# Patient Record
Sex: Female | Born: 1950
Health system: Southern US, Community
[De-identification: ages and names within clinical notes are randomized; demographics above are authoritative.]

## PROBLEM LIST (undated history)

## (undated) DIAGNOSIS — C801 Malignant (primary) neoplasm, unspecified: Secondary | ICD-10-CM

## (undated) DIAGNOSIS — K659 Peritonitis, unspecified: Secondary | ICD-10-CM

## (undated) DIAGNOSIS — C4490 Unspecified malignant neoplasm of skin, unspecified: Secondary | ICD-10-CM

## (undated) DIAGNOSIS — L988 Other specified disorders of the skin and subcutaneous tissue: Secondary | ICD-10-CM

## (undated) DIAGNOSIS — E538 Deficiency of other specified B group vitamins: Secondary | ICD-10-CM

## (undated) DIAGNOSIS — R110 Nausea: Secondary | ICD-10-CM

## (undated) DIAGNOSIS — I10 Essential (primary) hypertension: Secondary | ICD-10-CM

## (undated) HISTORY — DX: Other specified disorders of the skin and subcutaneous tissue: L98.8

## (undated) HISTORY — PX: APPENDECTOMY: SHX54

## (undated) HISTORY — DX: Malignant (primary) neoplasm, unspecified: C80.1

## (undated) HISTORY — DX: Unspecified malignant neoplasm of skin, unspecified: C44.90

## (undated) HISTORY — PX: ILEOSTOMY: SHX1783

## (undated) HISTORY — PX: COLOSTOMY: SHX63

## (undated) HISTORY — PX: ENTEROCUTANEOUS FISTULA CLOSURE: SHX1510

## (undated) HISTORY — PX: ABDOMINAL HYSTERECTOMY: SHX81

## (undated) HISTORY — DX: Peritonitis, unspecified: K65.9

## (undated) HISTORY — DX: Nausea: R11.0

## (undated) HISTORY — DX: Deficiency of other specified B group vitamins: E53.8

---

## 2005-04-19 ENCOUNTER — Ambulatory Visit: Payer: Self-pay | Admitting: Family Medicine

## 2007-11-20 ENCOUNTER — Ambulatory Visit: Payer: Self-pay | Admitting: Sports Medicine

## 2007-11-20 DIAGNOSIS — M25569 Pain in unspecified knee: Secondary | ICD-10-CM | POA: Insufficient documentation

## 2007-11-20 DIAGNOSIS — IMO0002 Reserved for concepts with insufficient information to code with codable children: Secondary | ICD-10-CM | POA: Insufficient documentation

## 2007-12-11 ENCOUNTER — Ambulatory Visit: Payer: Self-pay | Admitting: Sports Medicine

## 2007-12-11 DIAGNOSIS — I10 Essential (primary) hypertension: Secondary | ICD-10-CM | POA: Insufficient documentation

## 2007-12-11 DIAGNOSIS — M23302 Other meniscus derangements, unspecified lateral meniscus, unspecified knee: Secondary | ICD-10-CM | POA: Insufficient documentation

## 2007-12-17 ENCOUNTER — Encounter: Admission: RE | Admit: 2007-12-17 | Discharge: 2007-12-17 | Payer: Self-pay | Admitting: Sports Medicine

## 2007-12-19 ENCOUNTER — Telehealth: Payer: Self-pay | Admitting: Sports Medicine

## 2007-12-20 ENCOUNTER — Encounter (INDEPENDENT_AMBULATORY_CARE_PROVIDER_SITE_OTHER): Payer: Self-pay | Admitting: *Deleted

## 2008-01-07 ENCOUNTER — Ambulatory Visit: Payer: Self-pay | Admitting: Sports Medicine

## 2008-02-18 ENCOUNTER — Ambulatory Visit: Payer: Self-pay | Admitting: Sports Medicine

## 2008-04-15 ENCOUNTER — Ambulatory Visit: Payer: Self-pay | Admitting: Sports Medicine

## 2008-06-16 ENCOUNTER — Ambulatory Visit: Payer: Self-pay | Admitting: Sports Medicine

## 2008-06-16 DIAGNOSIS — Z85038 Personal history of other malignant neoplasm of large intestine: Secondary | ICD-10-CM | POA: Insufficient documentation

## 2011-08-21 DIAGNOSIS — Z432 Encounter for attention to ileostomy: Secondary | ICD-10-CM | POA: Diagnosis not present

## 2011-08-21 DIAGNOSIS — C20 Malignant neoplasm of rectum: Secondary | ICD-10-CM | POA: Diagnosis not present

## 2011-08-21 DIAGNOSIS — Z452 Encounter for adjustment and management of vascular access device: Secondary | ICD-10-CM | POA: Diagnosis not present

## 2011-08-21 DIAGNOSIS — T819XXA Unspecified complication of procedure, initial encounter: Secondary | ICD-10-CM | POA: Diagnosis not present

## 2011-08-30 DIAGNOSIS — C20 Malignant neoplasm of rectum: Secondary | ICD-10-CM | POA: Diagnosis not present

## 2011-08-30 DIAGNOSIS — Z432 Encounter for attention to ileostomy: Secondary | ICD-10-CM | POA: Diagnosis not present

## 2011-08-30 DIAGNOSIS — T819XXA Unspecified complication of procedure, initial encounter: Secondary | ICD-10-CM | POA: Diagnosis not present

## 2011-08-30 DIAGNOSIS — Z452 Encounter for adjustment and management of vascular access device: Secondary | ICD-10-CM | POA: Diagnosis not present

## 2011-08-31 DIAGNOSIS — Z432 Encounter for attention to ileostomy: Secondary | ICD-10-CM | POA: Diagnosis not present

## 2011-08-31 DIAGNOSIS — T819XXA Unspecified complication of procedure, initial encounter: Secondary | ICD-10-CM | POA: Diagnosis not present

## 2011-08-31 DIAGNOSIS — C20 Malignant neoplasm of rectum: Secondary | ICD-10-CM | POA: Diagnosis not present

## 2011-08-31 DIAGNOSIS — Z452 Encounter for adjustment and management of vascular access device: Secondary | ICD-10-CM | POA: Diagnosis not present

## 2011-09-06 DIAGNOSIS — Z79899 Other long term (current) drug therapy: Secondary | ICD-10-CM | POA: Diagnosis not present

## 2011-09-06 DIAGNOSIS — K632 Fistula of intestine: Secondary | ICD-10-CM | POA: Diagnosis not present

## 2011-09-06 DIAGNOSIS — T8183XA Persistent postprocedural fistula, initial encounter: Secondary | ICD-10-CM | POA: Diagnosis not present

## 2011-09-11 DIAGNOSIS — Z79899 Other long term (current) drug therapy: Secondary | ICD-10-CM | POA: Diagnosis not present

## 2011-09-11 DIAGNOSIS — D649 Anemia, unspecified: Secondary | ICD-10-CM | POA: Diagnosis not present

## 2011-09-11 DIAGNOSIS — Z452 Encounter for adjustment and management of vascular access device: Secondary | ICD-10-CM | POA: Diagnosis not present

## 2011-09-11 DIAGNOSIS — Z432 Encounter for attention to ileostomy: Secondary | ICD-10-CM | POA: Diagnosis not present

## 2011-09-11 DIAGNOSIS — T819XXA Unspecified complication of procedure, initial encounter: Secondary | ICD-10-CM | POA: Diagnosis not present

## 2011-09-11 DIAGNOSIS — C20 Malignant neoplasm of rectum: Secondary | ICD-10-CM | POA: Diagnosis not present

## 2011-09-18 DIAGNOSIS — Z452 Encounter for adjustment and management of vascular access device: Secondary | ICD-10-CM | POA: Diagnosis not present

## 2011-09-18 DIAGNOSIS — D649 Anemia, unspecified: Secondary | ICD-10-CM | POA: Diagnosis not present

## 2011-09-18 DIAGNOSIS — Z79899 Other long term (current) drug therapy: Secondary | ICD-10-CM | POA: Diagnosis not present

## 2011-09-18 DIAGNOSIS — T819XXA Unspecified complication of procedure, initial encounter: Secondary | ICD-10-CM | POA: Diagnosis not present

## 2011-09-18 DIAGNOSIS — C20 Malignant neoplasm of rectum: Secondary | ICD-10-CM | POA: Diagnosis not present

## 2011-09-18 DIAGNOSIS — Z432 Encounter for attention to ileostomy: Secondary | ICD-10-CM | POA: Diagnosis not present

## 2011-09-20 DIAGNOSIS — Z85048 Personal history of other malignant neoplasm of rectum, rectosigmoid junction, and anus: Secondary | ICD-10-CM | POA: Diagnosis not present

## 2011-09-20 DIAGNOSIS — K632 Fistula of intestine: Secondary | ICD-10-CM | POA: Diagnosis not present

## 2011-09-25 ENCOUNTER — Inpatient Hospital Stay: Payer: Self-pay | Admitting: Internal Medicine

## 2011-09-25 DIAGNOSIS — E869 Volume depletion, unspecified: Secondary | ICD-10-CM | POA: Diagnosis not present

## 2011-09-25 DIAGNOSIS — Z932 Ileostomy status: Secondary | ICD-10-CM | POA: Diagnosis not present

## 2011-09-25 DIAGNOSIS — Z9089 Acquired absence of other organs: Secondary | ICD-10-CM | POA: Diagnosis not present

## 2011-09-25 DIAGNOSIS — Z91041 Radiographic dye allergy status: Secondary | ICD-10-CM | POA: Diagnosis not present

## 2011-09-25 DIAGNOSIS — Z800 Family history of malignant neoplasm of digestive organs: Secondary | ICD-10-CM | POA: Diagnosis not present

## 2011-09-25 DIAGNOSIS — N179 Acute kidney failure, unspecified: Secondary | ICD-10-CM | POA: Diagnosis not present

## 2011-09-25 DIAGNOSIS — R112 Nausea with vomiting, unspecified: Secondary | ICD-10-CM | POA: Diagnosis not present

## 2011-09-25 DIAGNOSIS — Z9221 Personal history of antineoplastic chemotherapy: Secondary | ICD-10-CM | POA: Diagnosis not present

## 2011-09-25 DIAGNOSIS — E871 Hypo-osmolality and hyponatremia: Secondary | ICD-10-CM | POA: Diagnosis not present

## 2011-09-25 DIAGNOSIS — R1110 Vomiting, unspecified: Secondary | ICD-10-CM | POA: Diagnosis not present

## 2011-09-25 DIAGNOSIS — Z90710 Acquired absence of both cervix and uterus: Secondary | ICD-10-CM | POA: Diagnosis not present

## 2011-09-25 DIAGNOSIS — Z888 Allergy status to other drugs, medicaments and biological substances status: Secondary | ICD-10-CM | POA: Diagnosis not present

## 2011-09-25 DIAGNOSIS — Z91018 Allergy to other foods: Secondary | ICD-10-CM | POA: Diagnosis not present

## 2011-09-25 DIAGNOSIS — D4959 Neoplasm of unspecified behavior of other genitourinary organ: Secondary | ICD-10-CM | POA: Diagnosis not present

## 2011-09-25 DIAGNOSIS — C20 Malignant neoplasm of rectum: Secondary | ICD-10-CM | POA: Diagnosis present

## 2011-09-25 DIAGNOSIS — Z87891 Personal history of nicotine dependence: Secondary | ICD-10-CM | POA: Diagnosis not present

## 2011-09-25 LAB — COMPREHENSIVE METABOLIC PANEL
Albumin: 4.9 g/dL (ref 3.4–5.0)
Alkaline Phosphatase: 149 U/L — ABNORMAL HIGH (ref 50–136)
Anion Gap: 21 — ABNORMAL HIGH (ref 7–16)
Calcium, Total: 10.1 mg/dL (ref 8.5–10.1)
Chloride: 85 mmol/L — ABNORMAL LOW (ref 98–107)
Co2: 15 mmol/L — ABNORMAL LOW (ref 21–32)
EGFR (African American): 14 — ABNORMAL LOW
EGFR (Non-African Amer.): 12 — ABNORMAL LOW
Osmolality: 272 (ref 275–301)
Potassium: 5 mmol/L (ref 3.5–5.1)
SGOT(AST): 54 U/L — ABNORMAL HIGH (ref 15–37)
Sodium: 121 mmol/L — ABNORMAL LOW (ref 136–145)

## 2011-09-25 LAB — URINALYSIS, COMPLETE
Bacteria: NONE SEEN
Glucose,UR: NEGATIVE mg/dL (ref 0–75)
Hyaline Cast: 59
Leukocyte Esterase: NEGATIVE
Nitrite: NEGATIVE
Ph: 5 (ref 4.5–8.0)
Protein: 30
RBC,UR: 3 /HPF (ref 0–5)
Squamous Epithelial: 4
WBC UR: 7 /HPF (ref 0–5)

## 2011-09-25 LAB — CBC
MCH: 27.6 pg (ref 26.0–34.0)
MCV: 82 fL (ref 80–100)
Platelet: 419 10*3/uL (ref 150–440)
RDW: 13.4 % (ref 11.5–14.5)
WBC: 12.5 10*3/uL — ABNORMAL HIGH (ref 3.6–11.0)

## 2011-09-25 LAB — LIPASE, BLOOD: Lipase: 330 U/L (ref 73–393)

## 2011-09-26 LAB — BASIC METABOLIC PANEL
Anion Gap: 16 (ref 7–16)
BUN: 64 mg/dL — ABNORMAL HIGH (ref 7–18)
Calcium, Total: 8.3 mg/dL — ABNORMAL LOW (ref 8.5–10.1)
EGFR (African American): 33 — ABNORMAL LOW
EGFR (Non-African Amer.): 27 — ABNORMAL LOW
Glucose: 97 mg/dL (ref 65–99)
Osmolality: 283 (ref 275–301)
Potassium: 4 mmol/L (ref 3.5–5.1)

## 2011-09-26 LAB — CBC WITH DIFFERENTIAL/PLATELET
Basophil #: 0 10*3/uL (ref 0.0–0.1)
Eosinophil %: 0.8 %
HCT: 37.5 % (ref 35.0–47.0)
Lymphocyte #: 1.6 10*3/uL (ref 1.0–3.6)
Lymphocyte %: 17.4 %
MCHC: 33.2 g/dL (ref 32.0–36.0)
MCV: 83 fL (ref 80–100)
Monocyte %: 12.2 %
Platelet: 344 10*3/uL (ref 150–440)
RDW: 13.2 % (ref 11.5–14.5)
WBC: 9.3 10*3/uL (ref 3.6–11.0)

## 2011-09-27 LAB — MAGNESIUM: Magnesium: 1.5 mg/dL — ABNORMAL LOW

## 2011-09-27 LAB — BASIC METABOLIC PANEL
Anion Gap: 9 (ref 7–16)
BUN: 23 mg/dL — ABNORMAL HIGH (ref 7–18)
Calcium, Total: 8.4 mg/dL — ABNORMAL LOW (ref 8.5–10.1)
Chloride: 107 mmol/L (ref 98–107)
Co2: 22 mmol/L (ref 21–32)
Glucose: 95 mg/dL (ref 65–99)
Osmolality: 279 (ref 275–301)

## 2011-10-02 DIAGNOSIS — Z432 Encounter for attention to ileostomy: Secondary | ICD-10-CM | POA: Diagnosis not present

## 2011-10-02 DIAGNOSIS — T819XXA Unspecified complication of procedure, initial encounter: Secondary | ICD-10-CM | POA: Diagnosis not present

## 2011-10-02 DIAGNOSIS — Z452 Encounter for adjustment and management of vascular access device: Secondary | ICD-10-CM | POA: Diagnosis not present

## 2011-10-02 DIAGNOSIS — C20 Malignant neoplasm of rectum: Secondary | ICD-10-CM | POA: Diagnosis not present

## 2011-10-02 DIAGNOSIS — Z79899 Other long term (current) drug therapy: Secondary | ICD-10-CM | POA: Diagnosis not present

## 2011-10-16 DIAGNOSIS — Z85828 Personal history of other malignant neoplasm of skin: Secondary | ICD-10-CM | POA: Diagnosis not present

## 2011-10-16 DIAGNOSIS — L570 Actinic keratosis: Secondary | ICD-10-CM | POA: Diagnosis not present

## 2011-10-18 DIAGNOSIS — K632 Fistula of intestine: Secondary | ICD-10-CM | POA: Diagnosis not present

## 2011-10-18 DIAGNOSIS — C20 Malignant neoplasm of rectum: Secondary | ICD-10-CM | POA: Diagnosis not present

## 2011-10-18 DIAGNOSIS — Z85048 Personal history of other malignant neoplasm of rectum, rectosigmoid junction, and anus: Secondary | ICD-10-CM | POA: Diagnosis not present

## 2011-10-20 DIAGNOSIS — Z85038 Personal history of other malignant neoplasm of large intestine: Secondary | ICD-10-CM | POA: Diagnosis not present

## 2011-10-20 DIAGNOSIS — Z09 Encounter for follow-up examination after completed treatment for conditions other than malignant neoplasm: Secondary | ICD-10-CM | POA: Diagnosis not present

## 2011-10-20 DIAGNOSIS — Z86010 Personal history of colonic polyps: Secondary | ICD-10-CM | POA: Diagnosis not present

## 2011-10-20 DIAGNOSIS — Z538 Procedure and treatment not carried out for other reasons: Secondary | ICD-10-CM | POA: Diagnosis not present

## 2011-10-23 DIAGNOSIS — K6389 Other specified diseases of intestine: Secondary | ICD-10-CM | POA: Diagnosis not present

## 2011-10-23 DIAGNOSIS — Z09 Encounter for follow-up examination after completed treatment for conditions other than malignant neoplasm: Secondary | ICD-10-CM | POA: Diagnosis not present

## 2011-10-23 DIAGNOSIS — K5140 Inflammatory polyps of colon without complications: Secondary | ICD-10-CM | POA: Diagnosis not present

## 2011-10-23 DIAGNOSIS — Z85038 Personal history of other malignant neoplasm of large intestine: Secondary | ICD-10-CM | POA: Diagnosis not present

## 2011-10-23 DIAGNOSIS — Z980 Intestinal bypass and anastomosis status: Secondary | ICD-10-CM | POA: Diagnosis not present

## 2011-10-23 DIAGNOSIS — C20 Malignant neoplasm of rectum: Secondary | ICD-10-CM | POA: Diagnosis not present

## 2011-10-25 ENCOUNTER — Emergency Department: Payer: Self-pay

## 2011-10-25 DIAGNOSIS — R1110 Vomiting, unspecified: Secondary | ICD-10-CM | POA: Diagnosis not present

## 2011-10-25 DIAGNOSIS — E871 Hypo-osmolality and hyponatremia: Secondary | ICD-10-CM | POA: Diagnosis not present

## 2011-10-25 DIAGNOSIS — E870 Hyperosmolality and hypernatremia: Secondary | ICD-10-CM | POA: Diagnosis not present

## 2011-10-25 DIAGNOSIS — E860 Dehydration: Secondary | ICD-10-CM | POA: Diagnosis not present

## 2011-10-25 LAB — COMPREHENSIVE METABOLIC PANEL
Albumin: 4.6 g/dL (ref 3.4–5.0)
Alkaline Phosphatase: 149 U/L — ABNORMAL HIGH (ref 50–136)
Anion Gap: 14 (ref 7–16)
BUN: 31 mg/dL — ABNORMAL HIGH (ref 7–18)
Calcium, Total: 9.9 mg/dL (ref 8.5–10.1)
Chloride: 89 mmol/L — ABNORMAL LOW (ref 98–107)
Creatinine: 1.62 mg/dL — ABNORMAL HIGH (ref 0.60–1.30)
EGFR (Non-African Amer.): 35 — ABNORMAL LOW
Glucose: 106 mg/dL — ABNORMAL HIGH (ref 65–99)
Osmolality: 255 (ref 275–301)
Potassium: 4.6 mmol/L (ref 3.5–5.1)
Sodium: 123 mmol/L — ABNORMAL LOW (ref 136–145)
Total Protein: 8.9 g/dL — ABNORMAL HIGH (ref 6.4–8.2)

## 2011-10-25 LAB — URINALYSIS, COMPLETE
Bilirubin,UR: NEGATIVE
Blood: NEGATIVE
Leukocyte Esterase: NEGATIVE
Nitrite: NEGATIVE
Ph: 6 (ref 4.5–8.0)
Squamous Epithelial: <1

## 2011-10-25 LAB — CBC
HCT: 42.3 % (ref 35.0–47.0)
HGB: 14.4 g/dL (ref 12.0–16.0)
MCH: 27.5 pg (ref 26.0–34.0)
MCHC: 33.9 g/dL (ref 32.0–36.0)
MCV: 81 fL (ref 80–100)
RBC: 5.23 10*6/uL — ABNORMAL HIGH (ref 3.80–5.20)
RDW: 12.8 % (ref 11.5–14.5)
WBC: 6.6 10*3/uL (ref 3.6–11.0)

## 2011-10-26 ENCOUNTER — Encounter: Payer: Self-pay | Admitting: Family Medicine

## 2011-10-27 ENCOUNTER — Encounter: Payer: Self-pay | Admitting: Family Medicine

## 2011-10-27 ENCOUNTER — Ambulatory Visit (INDEPENDENT_AMBULATORY_CARE_PROVIDER_SITE_OTHER): Payer: Medicare Other | Admitting: Family Medicine

## 2011-10-27 ENCOUNTER — Telehealth: Payer: Self-pay | Admitting: Radiology

## 2011-10-27 VITALS — BP 112/80 | HR 91 | Temp 97.7°F | Wt 158.0 lb

## 2011-10-27 DIAGNOSIS — L089 Local infection of the skin and subcutaneous tissue, unspecified: Secondary | ICD-10-CM

## 2011-10-27 DIAGNOSIS — E871 Hypo-osmolality and hyponatremia: Secondary | ICD-10-CM

## 2011-10-27 DIAGNOSIS — E860 Dehydration: Secondary | ICD-10-CM | POA: Diagnosis not present

## 2011-10-27 DIAGNOSIS — L988 Other specified disorders of the skin and subcutaneous tissue: Secondary | ICD-10-CM

## 2011-10-27 DIAGNOSIS — C187 Malignant neoplasm of sigmoid colon: Secondary | ICD-10-CM

## 2011-10-27 DIAGNOSIS — N179 Acute kidney failure, unspecified: Secondary | ICD-10-CM

## 2011-10-27 LAB — BASIC METABOLIC PANEL
BUN: 25 mg/dL — ABNORMAL HIGH (ref 6–23)
CO2: 17 mEq/L — ABNORMAL LOW (ref 19–32)
Calcium: 9.7 mg/dL (ref 8.4–10.5)
Chloride: 94 mEq/L — ABNORMAL LOW (ref 96–112)
Creatinine, Ser: 1.4 mg/dL — ABNORMAL HIGH (ref 0.4–1.2)

## 2011-10-27 NOTE — Telephone Encounter (Signed)
I called pt.  The low Na has been a chronic issue.  Her Cr isn't worse.  She feels okay at home.  I would substitute gatorade for water and recheck lytes on Monday.  She agrees.  If any new sx in meantime, to Er.  She understood.

## 2011-10-27 NOTE — Patient Instructions (Signed)
I'll get your labs back at we'll let you know about the results.  Take care.  Keep drinking plenty of fluids.

## 2011-10-27 NOTE — Telephone Encounter (Signed)
[facility]  called a critical Na of 120

## 2011-10-27 NOTE — Progress Notes (Signed)
Had full remission of colon CA, then did well until last year.  Last year, she has appendiceal tumor and related infection that was causing GI symptoms.  That was treated with abx and appendectomy.  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  Was on TPN until 1/13.  She was weaned off TPN as po intake increased.  She got dehydrated.  She was admitted with ARF 1/13.  Cr up to ~4 with low Na.  Admitted, given fluids, and Cr decreased.    Was scheduled for colonoscopy to see if ileostomy could be taken down.  Partial completion of colonoscopy recently.  She then got dehydrated with vomiting.  Seen at ER with Cr up to 1.6 and Na 123.    No more vomiting.  Excrement in bag is at baseline now.  Normal UOP.  No fevers.  Feeling well except for mild occ cramping in hands.    PMH and SH reviewed  ROS: See HPI, otherwise noncontributory.  Meds, vitals, and allergies reviewed.   GEN: nad, alert and oriented HEENT: mucous membranes moist NECK: supple w/o LA CV: rrr. PULM: ctab, no inc wob ABD: soft, +bs, ileostomy site wnl, fistula track noted in midline EXT: no edema SKIN: no acute rash

## 2011-10-29 ENCOUNTER — Encounter: Payer: Self-pay | Admitting: Family Medicine

## 2011-10-29 DIAGNOSIS — E871 Hypo-osmolality and hyponatremia: Secondary | ICD-10-CM | POA: Insufficient documentation

## 2011-10-29 DIAGNOSIS — L988 Other specified disorders of the skin and subcutaneous tissue: Secondary | ICD-10-CM | POA: Insufficient documentation

## 2011-10-29 DIAGNOSIS — N179 Acute kidney failure, unspecified: Secondary | ICD-10-CM | POA: Insufficient documentation

## 2011-10-29 NOTE — Assessment & Plan Note (Signed)
Per [facility].  

## 2011-10-29 NOTE — Assessment & Plan Note (Signed)
Cr not worse on BMET, continue fluids, recheck Monday.  To ER if vomiting.

## 2011-10-29 NOTE — Assessment & Plan Note (Addendum)
Recheck Monday, will have patient use gatorade instead of only water over the weekend.  She likely has GI loss with ileostomy. Prev labs reviewed.  Pt called and aware. >30 min spent with face to face with patient, >50% counseling and/or coordinating care

## 2011-10-30 ENCOUNTER — Other Ambulatory Visit: Payer: Self-pay | Admitting: Family Medicine

## 2011-10-30 ENCOUNTER — Other Ambulatory Visit (INDEPENDENT_AMBULATORY_CARE_PROVIDER_SITE_OTHER): Payer: Medicare Other

## 2011-10-30 DIAGNOSIS — E871 Hypo-osmolality and hyponatremia: Secondary | ICD-10-CM

## 2011-10-30 LAB — BASIC METABOLIC PANEL
BUN: 18 mg/dL (ref 6–23)
Calcium: 9.6 mg/dL (ref 8.4–10.5)
Creatinine, Ser: 1.3 mg/dL — ABNORMAL HIGH (ref 0.4–1.2)
GFR: 46.27 mL/min — ABNORMAL LOW (ref >60.00)
Glucose, Bld: 82 mg/dL (ref 70–99)

## 2011-11-06 ENCOUNTER — Other Ambulatory Visit (INDEPENDENT_AMBULATORY_CARE_PROVIDER_SITE_OTHER): Payer: Medicare Other

## 2011-11-06 DIAGNOSIS — E871 Hypo-osmolality and hyponatremia: Secondary | ICD-10-CM

## 2011-11-06 LAB — BASIC METABOLIC PANEL
Calcium: 9.6 mg/dL (ref 8.4–10.5)
Creatinine, Ser: 1.4 mg/dL — ABNORMAL HIGH (ref 0.4–1.2)
GFR: 42.36 mL/min — ABNORMAL LOW (ref >60.00)
Sodium: 131 mEq/L — ABNORMAL LOW (ref 135–145)

## 2011-11-07 ENCOUNTER — Telehealth: Payer: Self-pay | Admitting: *Deleted

## 2011-11-07 NOTE — Telephone Encounter (Signed)
Pt is asking for lab results from yesterday.  Please review and advise.

## 2011-11-07 NOTE — Telephone Encounter (Signed)
Her labs are essentially unchanged.  I would continue as is, no change in meds.  When will she f/u at [facility]?

## 2011-11-07 NOTE — Telephone Encounter (Signed)
Noted, I'll await notes from [facility].

## 2011-11-07 NOTE — Telephone Encounter (Signed)
Patient advised.  She is being seen at [facility] tomorrow and that's why she wanted to know about the labs today.

## 2011-11-08 DIAGNOSIS — Z01810 Encounter for preprocedural cardiovascular examination: Secondary | ICD-10-CM | POA: Diagnosis not present

## 2011-11-08 DIAGNOSIS — Z932 Ileostomy status: Secondary | ICD-10-CM | POA: Diagnosis not present

## 2011-11-08 DIAGNOSIS — Z79899 Other long term (current) drug therapy: Secondary | ICD-10-CM | POA: Diagnosis not present

## 2011-11-08 DIAGNOSIS — K632 Fistula of intestine: Secondary | ICD-10-CM | POA: Diagnosis not present

## 2011-11-08 DIAGNOSIS — Z85048 Personal history of other malignant neoplasm of rectum, rectosigmoid junction, and anus: Secondary | ICD-10-CM | POA: Diagnosis not present

## 2011-11-08 DIAGNOSIS — Z01818 Encounter for other preprocedural examination: Secondary | ICD-10-CM | POA: Diagnosis not present

## 2011-11-08 DIAGNOSIS — Z5181 Encounter for therapeutic drug level monitoring: Secondary | ICD-10-CM | POA: Diagnosis not present

## 2011-11-08 DIAGNOSIS — C20 Malignant neoplasm of rectum: Secondary | ICD-10-CM | POA: Diagnosis not present

## 2011-11-28 DIAGNOSIS — Z432 Encounter for attention to ileostomy: Secondary | ICD-10-CM | POA: Diagnosis not present

## 2011-11-28 DIAGNOSIS — K660 Peritoneal adhesions (postprocedural) (postinfection): Secondary | ICD-10-CM | POA: Diagnosis not present

## 2011-11-28 DIAGNOSIS — K632 Fistula of intestine: Secondary | ICD-10-CM | POA: Diagnosis not present

## 2011-11-28 DIAGNOSIS — IMO0002 Reserved for concepts with insufficient information to code with codable children: Secondary | ICD-10-CM | POA: Diagnosis not present

## 2011-12-11 ENCOUNTER — Inpatient Hospital Stay: Payer: Self-pay | Admitting: Internal Medicine

## 2011-12-11 DIAGNOSIS — D500 Iron deficiency anemia secondary to blood loss (chronic): Secondary | ICD-10-CM | POA: Diagnosis present

## 2011-12-11 DIAGNOSIS — Z932 Ileostomy status: Secondary | ICD-10-CM | POA: Diagnosis not present

## 2011-12-11 DIAGNOSIS — Z885 Allergy status to narcotic agent status: Secondary | ICD-10-CM | POA: Diagnosis not present

## 2011-12-11 DIAGNOSIS — Z85048 Personal history of other malignant neoplasm of rectum, rectosigmoid junction, and anus: Secondary | ICD-10-CM | POA: Diagnosis not present

## 2011-12-11 DIAGNOSIS — E871 Hypo-osmolality and hyponatremia: Secondary | ICD-10-CM | POA: Diagnosis not present

## 2011-12-11 DIAGNOSIS — Z9089 Acquired absence of other organs: Secondary | ICD-10-CM | POA: Diagnosis not present

## 2011-12-11 DIAGNOSIS — D6959 Other secondary thrombocytopenia: Secondary | ICD-10-CM | POA: Diagnosis not present

## 2011-12-11 DIAGNOSIS — F329 Major depressive disorder, single episode, unspecified: Secondary | ICD-10-CM | POA: Diagnosis present

## 2011-12-11 DIAGNOSIS — Z9221 Personal history of antineoplastic chemotherapy: Secondary | ICD-10-CM | POA: Diagnosis not present

## 2011-12-11 DIAGNOSIS — E876 Hypokalemia: Secondary | ICD-10-CM | POA: Diagnosis present

## 2011-12-11 DIAGNOSIS — Z91041 Radiographic dye allergy status: Secondary | ICD-10-CM | POA: Diagnosis not present

## 2011-12-11 DIAGNOSIS — R7309 Other abnormal glucose: Secondary | ICD-10-CM | POA: Diagnosis present

## 2011-12-11 DIAGNOSIS — I959 Hypotension, unspecified: Secondary | ICD-10-CM | POA: Diagnosis not present

## 2011-12-11 DIAGNOSIS — D649 Anemia, unspecified: Secondary | ICD-10-CM | POA: Diagnosis not present

## 2011-12-11 DIAGNOSIS — N179 Acute kidney failure, unspecified: Secondary | ICD-10-CM | POA: Diagnosis not present

## 2011-12-11 DIAGNOSIS — Z91018 Allergy to other foods: Secondary | ICD-10-CM | POA: Diagnosis not present

## 2011-12-11 DIAGNOSIS — T8131XA Disruption of external operation (surgical) wound, not elsewhere classified, initial encounter: Secondary | ICD-10-CM | POA: Diagnosis not present

## 2011-12-11 DIAGNOSIS — Z90710 Acquired absence of both cervix and uterus: Secondary | ICD-10-CM | POA: Diagnosis not present

## 2011-12-11 DIAGNOSIS — Z888 Allergy status to other drugs, medicaments and biological substances status: Secondary | ICD-10-CM | POA: Diagnosis not present

## 2011-12-11 DIAGNOSIS — Z886 Allergy status to analgesic agent status: Secondary | ICD-10-CM | POA: Diagnosis not present

## 2011-12-11 DIAGNOSIS — E869 Volume depletion, unspecified: Secondary | ICD-10-CM | POA: Diagnosis not present

## 2011-12-11 DIAGNOSIS — E860 Dehydration: Secondary | ICD-10-CM | POA: Diagnosis not present

## 2011-12-11 DIAGNOSIS — R630 Anorexia: Secondary | ICD-10-CM | POA: Diagnosis not present

## 2011-12-11 LAB — COMPREHENSIVE METABOLIC PANEL
Albumin: 2.4 g/dL — ABNORMAL LOW (ref 3.4–5.0)
Anion Gap: 15 (ref 7–16)
Calcium, Total: 8.5 mg/dL (ref 8.5–10.1)
Co2: 30 mmol/L (ref 21–32)
EGFR (African American): 32 — ABNORMAL LOW
EGFR (Non-African Amer.): 27 — ABNORMAL LOW
Potassium: 3.5 mmol/L (ref 3.5–5.1)
SGOT(AST): 19 U/L (ref 15–37)
SGPT (ALT): 17 U/L
Total Protein: 7.4 g/dL (ref 6.4–8.2)

## 2011-12-11 LAB — CBC
HCT: 27.7 % — ABNORMAL LOW (ref 35.0–47.0)
HGB: 9 g/dL — ABNORMAL LOW (ref 12.0–16.0)
MCH: 27.4 pg (ref 26.0–34.0)
MCV: 84 fL (ref 80–100)
RBC: 3.29 10*6/uL — ABNORMAL LOW (ref 3.80–5.20)
RDW: 14.8 % — ABNORMAL HIGH (ref 11.5–14.5)
WBC: 12.2 10*3/uL — ABNORMAL HIGH (ref 3.6–11.0)

## 2011-12-12 LAB — CBC WITH DIFFERENTIAL/PLATELET
Basophil #: 0 10*3/uL (ref 0.0–0.1)
Eosinophil %: 1.3 %
HCT: 24 % — ABNORMAL LOW (ref 35.0–47.0)
Lymphocyte #: 1.1 10*3/uL (ref 1.0–3.6)
Lymphocyte %: 10.3 %
MCH: 27.5 pg (ref 26.0–34.0)
MCHC: 32.7 g/dL (ref 32.0–36.0)
MCV: 84 fL (ref 80–100)
Monocyte %: 14 %
Platelet: 910 10*3/uL — ABNORMAL HIGH (ref 150–440)
WBC: 10.3 10*3/uL (ref 3.6–11.0)

## 2011-12-12 LAB — MAGNESIUM: Magnesium: 1.3 mg/dL — ABNORMAL LOW

## 2011-12-12 LAB — BASIC METABOLIC PANEL
Calcium, Total: 7.9 mg/dL — ABNORMAL LOW (ref 8.5–10.1)
Chloride: 90 mmol/L — ABNORMAL LOW (ref 98–107)
Co2: 25 mmol/L (ref 21–32)
EGFR (Non-African Amer.): 39 — ABNORMAL LOW
Glucose: 102 mg/dL — ABNORMAL HIGH (ref 65–99)

## 2011-12-13 LAB — MAGNESIUM: Magnesium: 2.2 mg/dL

## 2011-12-13 LAB — CBC WITH DIFFERENTIAL/PLATELET
Basophil #: 0 10*3/uL (ref 0.0–0.1)
Basophil %: 0.2 %
Eosinophil %: 2.4 %
HCT: 35.8 % (ref 35.0–47.0)
Lymphocyte #: 1.6 10*3/uL (ref 1.0–3.6)
Lymphocyte %: 13 %
MCH: 28 pg (ref 26.0–34.0)
MCHC: 32.8 g/dL (ref 32.0–36.0)
MCV: 85 fL (ref 80–100)
Monocyte %: 13.4 %
Neutrophil #: 8.5 10*3/uL — ABNORMAL HIGH (ref 1.4–6.5)
Platelet: 1002 10*3/uL — ABNORMAL HIGH (ref 150–440)
RBC: 4.21 10*6/uL (ref 3.80–5.20)
WBC: 12 10*3/uL — ABNORMAL HIGH (ref 3.6–11.0)

## 2011-12-13 LAB — BASIC METABOLIC PANEL
BUN: 16 mg/dL (ref 7–18)
Calcium, Total: 8.9 mg/dL (ref 8.5–10.1)
Chloride: 92 mmol/L — ABNORMAL LOW (ref 98–107)
Co2: 30 mmol/L (ref 21–32)
EGFR (Non-African Amer.): 39 — ABNORMAL LOW
Osmolality: 260 (ref 275–301)
Sodium: 129 mmol/L — ABNORMAL LOW (ref 136–145)

## 2011-12-15 DIAGNOSIS — D72829 Elevated white blood cell count, unspecified: Secondary | ICD-10-CM | POA: Diagnosis present

## 2011-12-15 DIAGNOSIS — L0390 Cellulitis, unspecified: Secondary | ICD-10-CM | POA: Diagnosis not present

## 2011-12-15 DIAGNOSIS — N179 Acute kidney failure, unspecified: Secondary | ICD-10-CM | POA: Diagnosis present

## 2011-12-15 DIAGNOSIS — T8140XA Infection following a procedure, unspecified, initial encounter: Secondary | ICD-10-CM | POA: Diagnosis not present

## 2011-12-15 DIAGNOSIS — E860 Dehydration: Secondary | ICD-10-CM | POA: Diagnosis present

## 2011-12-15 DIAGNOSIS — R918 Other nonspecific abnormal finding of lung field: Secondary | ICD-10-CM | POA: Diagnosis not present

## 2011-12-15 DIAGNOSIS — Z85048 Personal history of other malignant neoplasm of rectum, rectosigmoid junction, and anus: Secondary | ICD-10-CM | POA: Diagnosis not present

## 2011-12-15 DIAGNOSIS — J90 Pleural effusion, not elsewhere classified: Secondary | ICD-10-CM | POA: Diagnosis not present

## 2011-12-15 DIAGNOSIS — K651 Peritoneal abscess: Secondary | ICD-10-CM | POA: Diagnosis present

## 2011-12-15 DIAGNOSIS — K632 Fistula of intestine: Secondary | ICD-10-CM | POA: Diagnosis not present

## 2011-12-15 DIAGNOSIS — N731 Chronic parametritis and pelvic cellulitis: Secondary | ICD-10-CM | POA: Diagnosis not present

## 2011-12-15 DIAGNOSIS — L0291 Cutaneous abscess, unspecified: Secondary | ICD-10-CM | POA: Diagnosis not present

## 2011-12-15 DIAGNOSIS — J9819 Other pulmonary collapse: Secondary | ICD-10-CM | POA: Diagnosis not present

## 2011-12-27 DIAGNOSIS — C189 Malignant neoplasm of colon, unspecified: Secondary | ICD-10-CM | POA: Diagnosis not present

## 2011-12-27 DIAGNOSIS — Z4801 Encounter for change or removal of surgical wound dressing: Secondary | ICD-10-CM | POA: Diagnosis not present

## 2011-12-27 DIAGNOSIS — F329 Major depressive disorder, single episode, unspecified: Secondary | ICD-10-CM | POA: Diagnosis not present

## 2011-12-27 DIAGNOSIS — R109 Unspecified abdominal pain: Secondary | ICD-10-CM | POA: Diagnosis not present

## 2011-12-27 DIAGNOSIS — R110 Nausea: Secondary | ICD-10-CM | POA: Diagnosis not present

## 2011-12-27 DIAGNOSIS — Z432 Encounter for attention to ileostomy: Secondary | ICD-10-CM | POA: Diagnosis not present

## 2011-12-29 DIAGNOSIS — R109 Unspecified abdominal pain: Secondary | ICD-10-CM | POA: Diagnosis not present

## 2011-12-29 DIAGNOSIS — Z432 Encounter for attention to ileostomy: Secondary | ICD-10-CM | POA: Diagnosis not present

## 2011-12-29 DIAGNOSIS — R110 Nausea: Secondary | ICD-10-CM | POA: Diagnosis not present

## 2011-12-29 DIAGNOSIS — C189 Malignant neoplasm of colon, unspecified: Secondary | ICD-10-CM | POA: Diagnosis not present

## 2011-12-29 DIAGNOSIS — F329 Major depressive disorder, single episode, unspecified: Secondary | ICD-10-CM | POA: Diagnosis not present

## 2011-12-29 DIAGNOSIS — Z4801 Encounter for change or removal of surgical wound dressing: Secondary | ICD-10-CM | POA: Diagnosis not present

## 2012-01-01 DIAGNOSIS — F0630 Mood disorder due to known physiological condition, unspecified: Secondary | ICD-10-CM | POA: Diagnosis not present

## 2012-01-01 DIAGNOSIS — Z932 Ileostomy status: Secondary | ICD-10-CM | POA: Diagnosis not present

## 2012-01-01 DIAGNOSIS — N1330 Unspecified hydronephrosis: Secondary | ICD-10-CM | POA: Diagnosis not present

## 2012-01-01 DIAGNOSIS — N2889 Other specified disorders of kidney and ureter: Secondary | ICD-10-CM | POA: Diagnosis not present

## 2012-01-01 DIAGNOSIS — R112 Nausea with vomiting, unspecified: Secondary | ICD-10-CM | POA: Diagnosis present

## 2012-01-01 DIAGNOSIS — F329 Major depressive disorder, single episode, unspecified: Secondary | ICD-10-CM | POA: Diagnosis not present

## 2012-01-01 DIAGNOSIS — N134 Hydroureter: Secondary | ICD-10-CM | POA: Diagnosis not present

## 2012-01-01 DIAGNOSIS — E860 Dehydration: Secondary | ICD-10-CM | POA: Diagnosis present

## 2012-01-01 DIAGNOSIS — R109 Unspecified abdominal pain: Secondary | ICD-10-CM | POA: Diagnosis not present

## 2012-01-01 DIAGNOSIS — K632 Fistula of intestine: Secondary | ICD-10-CM | POA: Diagnosis not present

## 2012-01-01 DIAGNOSIS — C779 Secondary and unspecified malignant neoplasm of lymph node, unspecified: Secondary | ICD-10-CM | POA: Diagnosis present

## 2012-01-01 DIAGNOSIS — N731 Chronic parametritis and pelvic cellulitis: Secondary | ICD-10-CM | POA: Diagnosis not present

## 2012-01-01 DIAGNOSIS — Z4801 Encounter for change or removal of surgical wound dressing: Secondary | ICD-10-CM | POA: Diagnosis not present

## 2012-01-01 DIAGNOSIS — Z4682 Encounter for fitting and adjustment of non-vascular catheter: Secondary | ICD-10-CM | POA: Diagnosis not present

## 2012-01-01 DIAGNOSIS — Z85048 Personal history of other malignant neoplasm of rectum, rectosigmoid junction, and anus: Secondary | ICD-10-CM | POA: Diagnosis not present

## 2012-01-01 DIAGNOSIS — T8183XA Persistent postprocedural fistula, initial encounter: Secondary | ICD-10-CM | POA: Diagnosis present

## 2012-01-01 DIAGNOSIS — R110 Nausea: Secondary | ICD-10-CM | POA: Diagnosis not present

## 2012-01-01 DIAGNOSIS — Z432 Encounter for attention to ileostomy: Secondary | ICD-10-CM | POA: Diagnosis not present

## 2012-01-01 DIAGNOSIS — F39 Unspecified mood [affective] disorder: Secondary | ICD-10-CM | POA: Diagnosis not present

## 2012-01-01 DIAGNOSIS — N179 Acute kidney failure, unspecified: Secondary | ICD-10-CM | POA: Diagnosis present

## 2012-01-01 DIAGNOSIS — C189 Malignant neoplasm of colon, unspecified: Secondary | ICD-10-CM | POA: Diagnosis not present

## 2012-01-01 DIAGNOSIS — C50919 Malignant neoplasm of unspecified site of unspecified female breast: Secondary | ICD-10-CM | POA: Diagnosis present

## 2012-01-10 DIAGNOSIS — Z932 Ileostomy status: Secondary | ICD-10-CM | POA: Diagnosis not present

## 2012-01-10 DIAGNOSIS — Z936 Other artificial openings of urinary tract status: Secondary | ICD-10-CM | POA: Diagnosis not present

## 2012-01-15 DIAGNOSIS — R109 Unspecified abdominal pain: Secondary | ICD-10-CM | POA: Diagnosis not present

## 2012-01-15 DIAGNOSIS — C189 Malignant neoplasm of colon, unspecified: Secondary | ICD-10-CM | POA: Diagnosis not present

## 2012-01-15 DIAGNOSIS — F329 Major depressive disorder, single episode, unspecified: Secondary | ICD-10-CM | POA: Diagnosis not present

## 2012-01-15 DIAGNOSIS — Z432 Encounter for attention to ileostomy: Secondary | ICD-10-CM | POA: Diagnosis not present

## 2012-01-15 DIAGNOSIS — Z4801 Encounter for change or removal of surgical wound dressing: Secondary | ICD-10-CM | POA: Diagnosis not present

## 2012-01-15 DIAGNOSIS — R110 Nausea: Secondary | ICD-10-CM | POA: Diagnosis not present

## 2012-01-17 DIAGNOSIS — N2889 Other specified disorders of kidney and ureter: Secondary | ICD-10-CM | POA: Diagnosis not present

## 2012-01-17 DIAGNOSIS — K632 Fistula of intestine: Secondary | ICD-10-CM | POA: Diagnosis not present

## 2012-01-24 DIAGNOSIS — N2889 Other specified disorders of kidney and ureter: Secondary | ICD-10-CM | POA: Diagnosis not present

## 2012-01-24 DIAGNOSIS — Z4682 Encounter for fitting and adjustment of non-vascular catheter: Secondary | ICD-10-CM | POA: Diagnosis not present

## 2012-01-24 DIAGNOSIS — L0291 Cutaneous abscess, unspecified: Secondary | ICD-10-CM | POA: Diagnosis not present

## 2012-02-07 DIAGNOSIS — Z85048 Personal history of other malignant neoplasm of rectum, rectosigmoid junction, and anus: Secondary | ICD-10-CM | POA: Diagnosis not present

## 2012-02-07 DIAGNOSIS — N2889 Other specified disorders of kidney and ureter: Secondary | ICD-10-CM | POA: Diagnosis not present

## 2012-02-07 DIAGNOSIS — Z4682 Encounter for fitting and adjustment of non-vascular catheter: Secondary | ICD-10-CM | POA: Diagnosis not present

## 2012-02-21 DIAGNOSIS — S31109A Unspecified open wound of abdominal wall, unspecified quadrant without penetration into peritoneal cavity, initial encounter: Secondary | ICD-10-CM | POA: Diagnosis not present

## 2012-02-21 DIAGNOSIS — Z4682 Encounter for fitting and adjustment of non-vascular catheter: Secondary | ICD-10-CM | POA: Diagnosis not present

## 2012-02-21 DIAGNOSIS — Z431 Encounter for attention to gastrostomy: Secondary | ICD-10-CM | POA: Diagnosis not present

## 2012-02-21 DIAGNOSIS — Z79899 Other long term (current) drug therapy: Secondary | ICD-10-CM | POA: Diagnosis not present

## 2012-03-13 DIAGNOSIS — C19 Malignant neoplasm of rectosigmoid junction: Secondary | ICD-10-CM | POA: Diagnosis not present

## 2012-03-13 DIAGNOSIS — Z9889 Other specified postprocedural states: Secondary | ICD-10-CM | POA: Diagnosis not present

## 2012-03-15 ENCOUNTER — Telehealth: Payer: Self-pay

## 2012-03-15 NOTE — Telephone Encounter (Signed)
Pt called for lab appt; Dr Joseph Art urologist at [facility] has scheduled pt for removal of stent from kidney on 03/21/12. Pt understood Dr Joseph Art office was faxing order for U/A to be done at our office. I have not seen fax and would have to get order from Dr Para March if test could be done here or if would done at [facility].Pt will call Dr Joseph Art office for info.

## 2012-03-18 ENCOUNTER — Telehealth: Payer: Self-pay | Admitting: Family Medicine

## 2012-03-18 ENCOUNTER — Other Ambulatory Visit: Payer: Medicare Other

## 2012-03-18 DIAGNOSIS — N179 Acute kidney failure, unspecified: Secondary | ICD-10-CM

## 2012-03-18 DIAGNOSIS — R300 Dysuria: Secondary | ICD-10-CM

## 2012-03-18 NOTE — Telephone Encounter (Signed)
Received fax from [facility]; Dr Kandis Nab U/A and urine culture. Left v/m for pt to call back for lab appt.

## 2012-03-18 NOTE — Telephone Encounter (Signed)
Patient notified as instructed by telephone. Pt scheduled lab appt today at 3:15pm.

## 2012-03-18 NOTE — Telephone Encounter (Signed)
Terri collected urine from pt and was only enough urine for C&S; from [facility] fax C&S was only test requested. Please send results to [phone] per [facility] fax request.

## 2012-03-18 NOTE — Telephone Encounter (Signed)
Please call pt.  Orders are in.  Thanks.

## 2012-03-18 NOTE — Patient Instructions (Signed)
Unable to do ua, specimen QNS

## 2012-03-18 NOTE — Telephone Encounter (Signed)
Pt scheduled for U/A and C&S per Dr Para March today. Pt notified by phone.

## 2012-03-18 NOTE — Telephone Encounter (Signed)
Caller: Isabelle/Patient; PCP: Sunnie Nielsen); CB#: [phone]; Call regarding Returning Call from [facility].  Reviewed EMR and see that fax was rec'd from [facility] and approval to get Urine for U/A, C&S. Patient needs lab appointment.  Note to office for follow up.  Patient states she will be available all afternoon - she would like to schedule for today if possible.

## 2012-03-19 ENCOUNTER — Other Ambulatory Visit: Payer: Medicare Other

## 2012-03-19 NOTE — Telephone Encounter (Signed)
I'll await C&S.

## 2012-03-20 DIAGNOSIS — L259 Unspecified contact dermatitis, unspecified cause: Secondary | ICD-10-CM | POA: Diagnosis not present

## 2012-03-20 LAB — URINE CULTURE: Colony Count: 50000

## 2012-03-21 DIAGNOSIS — N2889 Other specified disorders of kidney and ureter: Secondary | ICD-10-CM | POA: Diagnosis not present

## 2012-03-21 DIAGNOSIS — Z466 Encounter for fitting and adjustment of urinary device: Secondary | ICD-10-CM | POA: Diagnosis not present

## 2012-03-21 DIAGNOSIS — N821 Other female urinary-genital tract fistulae: Secondary | ICD-10-CM | POA: Diagnosis not present

## 2012-03-31 ENCOUNTER — Encounter: Payer: Self-pay | Admitting: Family Medicine

## 2012-04-22 DIAGNOSIS — Q649 Congenital malformation of urinary system, unspecified: Secondary | ICD-10-CM | POA: Diagnosis not present

## 2012-04-22 DIAGNOSIS — Z79899 Other long term (current) drug therapy: Secondary | ICD-10-CM | POA: Diagnosis not present

## 2012-04-22 DIAGNOSIS — N1330 Unspecified hydronephrosis: Secondary | ICD-10-CM | POA: Diagnosis not present

## 2012-04-22 DIAGNOSIS — C20 Malignant neoplasm of rectum: Secondary | ICD-10-CM | POA: Diagnosis not present

## 2012-04-22 DIAGNOSIS — Q638 Other specified congenital malformations of kidney: Secondary | ICD-10-CM | POA: Diagnosis not present

## 2012-04-24 DIAGNOSIS — Z85828 Personal history of other malignant neoplasm of skin: Secondary | ICD-10-CM | POA: Diagnosis not present

## 2012-04-24 DIAGNOSIS — N1330 Unspecified hydronephrosis: Secondary | ICD-10-CM | POA: Diagnosis not present

## 2012-04-24 DIAGNOSIS — L259 Unspecified contact dermatitis, unspecified cause: Secondary | ICD-10-CM | POA: Diagnosis not present

## 2012-05-09 DIAGNOSIS — L259 Unspecified contact dermatitis, unspecified cause: Secondary | ICD-10-CM | POA: Diagnosis not present

## 2012-05-14 DIAGNOSIS — L259 Unspecified contact dermatitis, unspecified cause: Secondary | ICD-10-CM | POA: Diagnosis not present

## 2012-05-15 DIAGNOSIS — Z932 Ileostomy status: Secondary | ICD-10-CM | POA: Diagnosis not present

## 2012-05-15 DIAGNOSIS — K6389 Other specified diseases of intestine: Secondary | ICD-10-CM | POA: Diagnosis not present

## 2012-05-15 DIAGNOSIS — Z79899 Other long term (current) drug therapy: Secondary | ICD-10-CM | POA: Diagnosis not present

## 2012-05-15 DIAGNOSIS — Z85048 Personal history of other malignant neoplasm of rectum, rectosigmoid junction, and anus: Secondary | ICD-10-CM | POA: Diagnosis not present

## 2012-05-15 DIAGNOSIS — Z01818 Encounter for other preprocedural examination: Secondary | ICD-10-CM | POA: Diagnosis not present

## 2012-05-16 DIAGNOSIS — L259 Unspecified contact dermatitis, unspecified cause: Secondary | ICD-10-CM | POA: Diagnosis not present

## 2012-05-20 DIAGNOSIS — L259 Unspecified contact dermatitis, unspecified cause: Secondary | ICD-10-CM | POA: Diagnosis not present

## 2012-05-30 DIAGNOSIS — Z85048 Personal history of other malignant neoplasm of rectum, rectosigmoid junction, and anus: Secondary | ICD-10-CM | POA: Diagnosis not present

## 2012-05-30 DIAGNOSIS — K632 Fistula of intestine: Secondary | ICD-10-CM | POA: Diagnosis not present

## 2012-05-30 DIAGNOSIS — N1330 Unspecified hydronephrosis: Secondary | ICD-10-CM | POA: Diagnosis not present

## 2012-05-30 DIAGNOSIS — E860 Dehydration: Secondary | ICD-10-CM | POA: Diagnosis not present

## 2012-05-30 DIAGNOSIS — Z432 Encounter for attention to ileostomy: Secondary | ICD-10-CM | POA: Diagnosis not present

## 2012-05-30 DIAGNOSIS — D630 Anemia in neoplastic disease: Secondary | ICD-10-CM | POA: Diagnosis present

## 2012-05-30 DIAGNOSIS — N179 Acute kidney failure, unspecified: Secondary | ICD-10-CM | POA: Diagnosis not present

## 2012-06-07 DIAGNOSIS — Z09 Encounter for follow-up examination after completed treatment for conditions other than malignant neoplasm: Secondary | ICD-10-CM | POA: Diagnosis not present

## 2012-06-07 DIAGNOSIS — C787 Secondary malignant neoplasm of liver and intrahepatic bile duct: Secondary | ICD-10-CM | POA: Diagnosis not present

## 2012-06-07 DIAGNOSIS — Z79899 Other long term (current) drug therapy: Secondary | ICD-10-CM | POA: Diagnosis not present

## 2012-06-07 DIAGNOSIS — Z85048 Personal history of other malignant neoplasm of rectum, rectosigmoid junction, and anus: Secondary | ICD-10-CM | POA: Diagnosis not present

## 2012-06-12 DIAGNOSIS — Z9889 Other specified postprocedural states: Secondary | ICD-10-CM | POA: Diagnosis not present

## 2012-06-12 DIAGNOSIS — Z09 Encounter for follow-up examination after completed treatment for conditions other than malignant neoplasm: Secondary | ICD-10-CM | POA: Diagnosis not present

## 2012-06-12 DIAGNOSIS — Z85048 Personal history of other malignant neoplasm of rectum, rectosigmoid junction, and anus: Secondary | ICD-10-CM | POA: Diagnosis not present

## 2012-06-12 DIAGNOSIS — R197 Diarrhea, unspecified: Secondary | ICD-10-CM | POA: Diagnosis not present

## 2012-06-19 DIAGNOSIS — Z85048 Personal history of other malignant neoplasm of rectum, rectosigmoid junction, and anus: Secondary | ICD-10-CM | POA: Diagnosis not present

## 2012-06-19 DIAGNOSIS — Z09 Encounter for follow-up examination after completed treatment for conditions other than malignant neoplasm: Secondary | ICD-10-CM | POA: Diagnosis not present

## 2012-06-20 ENCOUNTER — Telehealth: Payer: Self-pay | Admitting: *Deleted

## 2012-06-20 DIAGNOSIS — E538 Deficiency of other specified B group vitamins: Secondary | ICD-10-CM

## 2012-06-20 NOTE — Telephone Encounter (Signed)
I'll await the fax and then we can address this.  Thanks.

## 2012-06-20 NOTE — Telephone Encounter (Signed)
Pt called stating she is a cancer pt @ [facility], had an ileostomy recently and recent labs showed she is not absorbing b12 well. She will need to have b12 shots done and she wants to have them here instead of going to [facility]. She says they are to fax you copy of labs today.

## 2012-06-21 ENCOUNTER — Telehealth: Payer: Self-pay | Admitting: Family Medicine

## 2012-06-21 NOTE — Telephone Encounter (Signed)
Forward  5 pages [facility] to Dr. Crawford Givens for review on 06-21-12 ym

## 2012-06-24 NOTE — Telephone Encounter (Signed)
Spoke with patient to let her know we have received the records.  Please advise the dosage and at what intervals the B-12 injections will be.

## 2012-06-24 NOTE — Telephone Encounter (Signed)
Pt called to ck on Dr Lianne Bushy recommendation; records from [facility] are on Dr Lianne Bushy desk and when reviewed pt like to be contacted at [phone].

## 2012-06-24 NOTE — Telephone Encounter (Signed)
Just got the fax today- notify pt and we'll send word. Please send the note back to me after talking to pt.  Thanks.

## 2012-06-25 MED ORDER — CYANOCOBALAMIN 1000 MCG/ML IJ SOLN: 1000.0000 ug | INTRAMUSCULAR | Status: DC

## 2012-06-25 NOTE — Telephone Encounter (Signed)
Patient advised.  First B-12 injection scheduled.  Patient will schedule CPE and 6 months recheck labs when she comes in for the B-12 injection.

## 2012-06-25 NOTE — Telephone Encounter (Signed)
Needs B12 IM q month with recheck B12 and CBC in 6 months. Orders are in.  Thanks.

## 2012-06-26 ENCOUNTER — Ambulatory Visit (INDEPENDENT_AMBULATORY_CARE_PROVIDER_SITE_OTHER): Payer: Medicare Other | Admitting: *Deleted

## 2012-06-26 ENCOUNTER — Telehealth: Payer: Self-pay | Admitting: Family Medicine

## 2012-06-26 DIAGNOSIS — N179 Acute kidney failure, unspecified: Secondary | ICD-10-CM

## 2012-06-26 MED ORDER — CYANOCOBALAMIN 1000 MCG/ML IJ SOLN
1000.0000 ug | Freq: Once | INTRAMUSCULAR | Status: AC
  Administered 2012-06-26: 1000 ug via INTRAMUSCULAR

## 2012-06-26 NOTE — Telephone Encounter (Signed)
See if she can get moved up on the schedule- slot.  Thanks.

## 2012-06-27 NOTE — Telephone Encounter (Signed)
Patient rescheduled her appointment to 08/13/12.

## 2012-07-03 DIAGNOSIS — Z9889 Other specified postprocedural states: Secondary | ICD-10-CM | POA: Diagnosis not present

## 2012-07-03 DIAGNOSIS — Z09 Encounter for follow-up examination after completed treatment for conditions other than malignant neoplasm: Secondary | ICD-10-CM | POA: Diagnosis not present

## 2012-07-03 DIAGNOSIS — Z85048 Personal history of other malignant neoplasm of rectum, rectosigmoid junction, and anus: Secondary | ICD-10-CM | POA: Diagnosis not present

## 2012-07-22 DIAGNOSIS — N1330 Unspecified hydronephrosis: Secondary | ICD-10-CM | POA: Diagnosis not present

## 2012-07-22 DIAGNOSIS — L570 Actinic keratosis: Secondary | ICD-10-CM | POA: Diagnosis not present

## 2012-07-22 DIAGNOSIS — Z85828 Personal history of other malignant neoplasm of skin: Secondary | ICD-10-CM | POA: Diagnosis not present

## 2012-07-22 DIAGNOSIS — D485 Neoplasm of uncertain behavior of skin: Secondary | ICD-10-CM | POA: Diagnosis not present

## 2012-07-28 ENCOUNTER — Other Ambulatory Visit: Payer: Self-pay | Admitting: Family Medicine

## 2012-07-28 DIAGNOSIS — E538 Deficiency of other specified B group vitamins: Secondary | ICD-10-CM

## 2012-07-28 DIAGNOSIS — Z13220 Encounter for screening for lipoid disorders: Secondary | ICD-10-CM

## 2012-07-28 DIAGNOSIS — Z8679 Personal history of other diseases of the circulatory system: Secondary | ICD-10-CM

## 2012-07-28 DIAGNOSIS — Z823 Family history of stroke: Secondary | ICD-10-CM

## 2012-07-29 ENCOUNTER — Other Ambulatory Visit: Payer: Self-pay | Admitting: Family Medicine

## 2012-07-29 DIAGNOSIS — I10 Essential (primary) hypertension: Secondary | ICD-10-CM

## 2012-07-29 DIAGNOSIS — Z13220 Encounter for screening for lipoid disorders: Secondary | ICD-10-CM

## 2012-07-30 ENCOUNTER — Ambulatory Visit: Payer: Medicare Other

## 2012-08-02 ENCOUNTER — Ambulatory Visit (INDEPENDENT_AMBULATORY_CARE_PROVIDER_SITE_OTHER): Payer: Medicare Other | Admitting: *Deleted

## 2012-08-02 DIAGNOSIS — E538 Deficiency of other specified B group vitamins: Secondary | ICD-10-CM | POA: Diagnosis not present

## 2012-08-02 MED ORDER — CYANOCOBALAMIN 1000 MCG/ML IJ SOLN
1000.0000 ug | Freq: Once | INTRAMUSCULAR | Status: AC
  Administered 2012-08-02: 1000 ug via INTRAMUSCULAR

## 2012-08-06 ENCOUNTER — Other Ambulatory Visit (INDEPENDENT_AMBULATORY_CARE_PROVIDER_SITE_OTHER): Payer: Medicare Other

## 2012-08-06 DIAGNOSIS — I10 Essential (primary) hypertension: Secondary | ICD-10-CM

## 2012-08-06 DIAGNOSIS — Z8679 Personal history of other diseases of the circulatory system: Secondary | ICD-10-CM | POA: Diagnosis not present

## 2012-08-06 DIAGNOSIS — E538 Deficiency of other specified B group vitamins: Secondary | ICD-10-CM | POA: Diagnosis not present

## 2012-08-06 LAB — CBC WITH DIFFERENTIAL/PLATELET
Basophils Relative: 0.8 % (ref 0.0–3.0)
Eosinophils Relative: 10.2 % — ABNORMAL HIGH (ref 0.0–5.0)
Hemoglobin: 12.1 g/dL (ref 12.0–15.0)
MCV: 85 fl (ref 78.0–100.0)
Monocytes Absolute: 0.5 10*3/uL (ref 0.1–1.0)
Neutrophils Relative %: 59.2 % (ref 43.0–77.0)
RBC: 4.4 Mil/uL (ref 3.87–5.11)
WBC: 6.4 10*3/uL (ref 4.5–10.5)

## 2012-08-06 LAB — LDL CHOLESTEROL, DIRECT: Direct LDL: 60.1 mg/dL

## 2012-08-06 LAB — COMPREHENSIVE METABOLIC PANEL
Albumin: 3.7 g/dL (ref 3.5–5.2)
Alkaline Phosphatase: 75 U/L (ref 39–117)
BUN: 25 mg/dL — ABNORMAL HIGH (ref 6–23)
Creatinine, Ser: 1.4 mg/dL — ABNORMAL HIGH (ref 0.4–1.2)
Glucose, Bld: 98 mg/dL (ref 70–99)
Potassium: 4.4 mEq/L (ref 3.5–5.1)
Total Bilirubin: 0.7 mg/dL (ref 0.3–1.2)

## 2012-08-06 LAB — LIPID PANEL
HDL: 138.2 mg/dL (ref >39.00)
Total CHOL/HDL Ratio: 2
Triglycerides: 83 mg/dL (ref 0.0–149.0)

## 2012-08-13 ENCOUNTER — Encounter: Payer: Self-pay | Admitting: Family Medicine

## 2012-08-13 ENCOUNTER — Ambulatory Visit (INDEPENDENT_AMBULATORY_CARE_PROVIDER_SITE_OTHER): Payer: Medicare Other | Admitting: Family Medicine

## 2012-08-13 VITALS — BP 180/94 | HR 77 | Temp 97.8°F | Ht 66.0 in | Wt 165.8 lb

## 2012-08-13 DIAGNOSIS — Z1231 Encounter for screening mammogram for malignant neoplasm of breast: Secondary | ICD-10-CM

## 2012-08-13 DIAGNOSIS — I10 Essential (primary) hypertension: Secondary | ICD-10-CM

## 2012-08-13 DIAGNOSIS — Z23 Encounter for immunization: Secondary | ICD-10-CM | POA: Diagnosis not present

## 2012-08-13 DIAGNOSIS — Z0000 Encounter for general adult medical examination without abnormal findings: Secondary | ICD-10-CM

## 2012-08-13 DIAGNOSIS — E538 Deficiency of other specified B group vitamins: Secondary | ICD-10-CM

## 2012-08-13 NOTE — Patient Instructions (Addendum)
See Shirlee Limerick about your referral before you leave today. Take care.  Glad to see you.  I would get a flu shot each fall.

## 2012-08-13 NOTE — Progress Notes (Signed)
I have personally reviewed the Medicare Annual Wellness questionnaire and have noted 1. The patient's medical and social history 2. Their use of alcohol, tobacco or illicit drugs 3. Their current medications and supplements 4. The patient's functional ability including ADL's, fall risks, home safety risks and hearing or visual             impairment. 5. Diet and physical activities 6. Evidence for depression or mood disorders  The patients weight, height, BMI have been recorded in the chart and visual acuity is per eye clinic.  I have made referrals, counseling and provided education to the patient based review of the above and I have provided the pt with a written personalized care plan for preventive services.  See scanned forms.  Routine anticipatory guidance given to patient.  See health maintenance. Flu shot encouraged Shingles at 62 PNA at 67 Tetanus 2013 Colonoscopy per [facility] Breast cancer screening- due for mammogram Cognitive function addressed- see scanned forms- and if abnormal then additional documentation follows.  Labs d/w pt.  Pap not indicated.    She continues to have some itching from imodium and lomotil but this is slowly improving.  Discussed with patient about topical benadryl and/or oatmeal preps.    She also has f/u with [facility] derm pending for skin cancer.   She is happy about her ileostomy takedown and recovery.  She is making some progress with her bowel movements.    She is on B12 replacement.    PMH and SH reviewed  Meds, vitals, and allergies reviewed.   ROS: See HPI.  Otherwise negative.    GEN: nad, alert and oriented HEENT: mucous membranes moist NECK: supple w/o LA CV: rrr. PULM: ctab, no inc wob ABD: soft, +bs, mult healed scars noted EXT: no edema SKIN: mild superficial erythema on the back, nondermatomal.  blanching

## 2012-08-13 NOTE — Assessment & Plan Note (Addendum)
Recheck in clinic 160/100 but has been normal out of clinic.  She'll check periodically.

## 2012-08-13 NOTE — Assessment & Plan Note (Signed)
On monthly injection with plan for f/u B12 2014.

## 2012-08-13 NOTE — Assessment & Plan Note (Signed)
See scanned forms.  Routine anticipatory guidance given to patient.  See health maintenance. Flu shot encouraged Shingles at 62 PNA at 67 Tetanus 2013 Colonoscopy per [facility] Breast cancer screening- due for mammogram Cognitive function addressed- see scanned forms- and if abnormal then additional documentation follows.  Labs d/w pt.

## 2012-08-19 DIAGNOSIS — C44320 Squamous cell carcinoma of skin of unspecified parts of face: Secondary | ICD-10-CM | POA: Diagnosis not present

## 2012-08-19 DIAGNOSIS — N1330 Unspecified hydronephrosis: Secondary | ICD-10-CM | POA: Diagnosis not present

## 2012-08-29 ENCOUNTER — Ambulatory Visit: Payer: Medicare Other

## 2012-09-05 ENCOUNTER — Ambulatory Visit (INDEPENDENT_AMBULATORY_CARE_PROVIDER_SITE_OTHER): Payer: Medicare Other | Admitting: *Deleted

## 2012-09-05 DIAGNOSIS — E538 Deficiency of other specified B group vitamins: Secondary | ICD-10-CM

## 2012-09-05 MED ORDER — CYANOCOBALAMIN 1000 MCG/ML IJ SOLN
1000.0000 ug | Freq: Once | INTRAMUSCULAR | Status: AC
  Administered 2012-09-05: 1000 ug via INTRAMUSCULAR

## 2012-09-16 ENCOUNTER — Other Ambulatory Visit: Payer: Medicare Other

## 2012-09-23 ENCOUNTER — Encounter: Payer: Medicare Other | Admitting: Family Medicine

## 2012-09-25 ENCOUNTER — Ambulatory Visit: Payer: Medicare Other

## 2012-10-01 DIAGNOSIS — L905 Scar conditions and fibrosis of skin: Secondary | ICD-10-CM | POA: Diagnosis not present

## 2012-10-01 DIAGNOSIS — C44319 Basal cell carcinoma of skin of other parts of face: Secondary | ICD-10-CM | POA: Diagnosis not present

## 2012-10-01 DIAGNOSIS — Z85828 Personal history of other malignant neoplasm of skin: Secondary | ICD-10-CM | POA: Diagnosis not present

## 2012-10-08 ENCOUNTER — Ambulatory Visit (INDEPENDENT_AMBULATORY_CARE_PROVIDER_SITE_OTHER): Payer: Medicare Other | Admitting: *Deleted

## 2012-10-08 DIAGNOSIS — E538 Deficiency of other specified B group vitamins: Secondary | ICD-10-CM | POA: Diagnosis not present

## 2012-10-08 MED ORDER — CYANOCOBALAMIN 1000 MCG/ML IJ SOLN
1000.0000 ug | Freq: Once | INTRAMUSCULAR | Status: AC
  Administered 2012-10-08: 1000 ug via INTRAMUSCULAR

## 2012-10-22 ENCOUNTER — Ambulatory Visit: Payer: Medicare Other

## 2012-10-30 ENCOUNTER — Ambulatory Visit
Admission: RE | Admit: 2012-10-30 | Discharge: 2012-10-30 | Disposition: A | Discharge status: Home / Self Care | Payer: Medicare Other | Source: Ambulatory Visit | Attending: Family Medicine | Admitting: Family Medicine

## 2012-10-30 DIAGNOSIS — Z1231 Encounter for screening mammogram for malignant neoplasm of breast: Secondary | ICD-10-CM | POA: Diagnosis not present

## 2012-10-31 ENCOUNTER — Encounter: Payer: Self-pay | Admitting: *Deleted

## 2012-11-05 ENCOUNTER — Ambulatory Visit (INDEPENDENT_AMBULATORY_CARE_PROVIDER_SITE_OTHER): Payer: Medicare Other | Admitting: *Deleted

## 2012-11-05 DIAGNOSIS — E538 Deficiency of other specified B group vitamins: Secondary | ICD-10-CM | POA: Diagnosis not present

## 2012-11-05 MED ORDER — CYANOCOBALAMIN 1000 MCG/ML IJ SOLN
1000.0000 ug | Freq: Once | INTRAMUSCULAR | Status: AC
  Administered 2012-11-05: 1000 ug via INTRAMUSCULAR

## 2012-12-06 ENCOUNTER — Telehealth: Payer: Self-pay | Admitting: *Deleted

## 2012-12-06 ENCOUNTER — Ambulatory Visit (INDEPENDENT_AMBULATORY_CARE_PROVIDER_SITE_OTHER): Payer: Medicare Other | Admitting: *Deleted

## 2012-12-06 DIAGNOSIS — E538 Deficiency of other specified B group vitamins: Secondary | ICD-10-CM

## 2012-12-06 DIAGNOSIS — Z85038 Personal history of other malignant neoplasm of large intestine: Secondary | ICD-10-CM

## 2012-12-06 MED ORDER — CYANOCOBALAMIN 1000 MCG/ML IJ SOLN
1000.0000 ug | Freq: Once | INTRAMUSCULAR | Status: AC
  Administered 2012-12-06: 1000 ug via INTRAMUSCULAR

## 2012-12-06 NOTE — Telephone Encounter (Signed)
Pt is coming in for labs on 4/23 and would like to get a CEA checked at that time.  If ok just add it and she will ask about it when she comes in.

## 2012-12-06 NOTE — Telephone Encounter (Signed)
Added

## 2012-12-23 DIAGNOSIS — Z85828 Personal history of other malignant neoplasm of skin: Secondary | ICD-10-CM | POA: Diagnosis not present

## 2012-12-23 DIAGNOSIS — L570 Actinic keratosis: Secondary | ICD-10-CM | POA: Diagnosis not present

## 2012-12-23 DIAGNOSIS — D235 Other benign neoplasm of skin of trunk: Secondary | ICD-10-CM | POA: Diagnosis not present

## 2012-12-25 ENCOUNTER — Encounter: Payer: Self-pay | Admitting: Family Medicine

## 2012-12-25 ENCOUNTER — Ambulatory Visit (INDEPENDENT_AMBULATORY_CARE_PROVIDER_SITE_OTHER): Payer: Medicare Other | Admitting: Family Medicine

## 2012-12-25 VITALS — BP 140/80 | HR 84 | Temp 98.5°F | Wt 164.0 lb

## 2012-12-25 DIAGNOSIS — Z85038 Personal history of other malignant neoplasm of large intestine: Secondary | ICD-10-CM

## 2012-12-25 DIAGNOSIS — E538 Deficiency of other specified B group vitamins: Secondary | ICD-10-CM | POA: Diagnosis not present

## 2012-12-25 NOTE — Patient Instructions (Addendum)
Go to the lab on the way out.  We'll contact you with your lab report. Take care.  Glad to see you.   We'll be in touch about the B12 options.

## 2012-12-25 NOTE — Progress Notes (Signed)
Due for B12 f/u.  Has been on replacement.  No ADE.  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    Colon cancer- 10 years out now.  Had ileostomy takedown last year. Still with variable diarrhea.  She limits fiber to help with this.  No blood in stool.  No vomiting.  No "B" symptoms.    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   Meds, vitals, and allergies reviewed.   ROS: See HPI.  Otherwise, noncontributory.  nad ncat Mmm rrr Ctab abd soft, mult healed scars noted, normal BS Ext w/o edema.

## 2012-12-25 NOTE — Assessment & Plan Note (Signed)
Doing well, check CEA today.

## 2012-12-25 NOTE — Addendum Note (Signed)
Addended by: Josph Macho A on: 12/25/2012 05:24 PM   Modules accepted: Orders

## 2012-12-25 NOTE — Assessment & Plan Note (Signed)
Recheck labs today and notify pt.  She agrees.  See notes on labs.

## 2012-12-26 LAB — CEA: CEA: 2.9 ng/mL (ref 0.0–5.0)

## 2012-12-26 LAB — CBC WITH DIFFERENTIAL/PLATELET
Basophils Absolute: 0 10*3/uL (ref 0.0–0.1)
Eosinophils Absolute: 0.4 10*3/uL (ref 0.0–0.7)
Lymphocytes Relative: 20 % (ref 12.0–46.0)
MCHC: 33.4 g/dL (ref 30.0–36.0)
Neutrophils Relative %: 65.6 % (ref 43.0–77.0)
RDW: 13.7 % (ref 11.5–14.6)

## 2012-12-27 ENCOUNTER — Encounter: Payer: Self-pay | Admitting: *Deleted

## 2012-12-27 ENCOUNTER — Other Ambulatory Visit: Payer: Self-pay | Admitting: Family Medicine

## 2012-12-27 DIAGNOSIS — E538 Deficiency of other specified B group vitamins: Secondary | ICD-10-CM

## 2012-12-27 MED ORDER — CYANOCOBALAMIN 1000 MCG PO TABS: 1000.0000 ug | ORAL_TABLET | Freq: Every day | ORAL | Status: DC

## 2013-03-13 ENCOUNTER — Telehealth: Payer: Self-pay

## 2013-03-13 MED ORDER — DIPHENOXYLATE-ATROPINE 2.5-0.025 MG PO TABS: 1.0000 | ORAL_TABLET | Freq: Three times a day (TID) | ORAL | Status: DC | PRN

## 2013-03-13 NOTE — Telephone Encounter (Signed)
Clarify with patient.  I thought she had a rash on lomotil and was off the medicine.  Let me know.  Thanks.

## 2013-03-13 NOTE — Telephone Encounter (Signed)
Pt request refill on generic Lomotil 2.5 mg taking 3 tabs q8h to [facility] pharmacy. Pt said was given Lomotil after surgery at [facility] last fall. Pt is out of med. Pt had ileostomy last fall with removal of small intestine and takes Lomotil daily not prn. Please advise.

## 2013-03-13 NOTE — Telephone Encounter (Signed)
Please call in.  Thanks.   

## 2013-03-13 NOTE — Telephone Encounter (Signed)
Patient says she did have a terrible rash at one time but it was when she was taking Imodium and Lomotil simultaneously.  She later found out that it was the Imodium that was causing the rash and she does well on the Lomotil.  She says for now, this seems to be doing great.  Please advise.

## 2013-03-14 ENCOUNTER — Other Ambulatory Visit: Payer: Self-pay | Admitting: *Deleted

## 2013-03-14 MED ORDER — DIPHENOXYLATE-ATROPINE 2.5-0.025 MG PO TABS: ORAL_TABLET | ORAL | Status: DC

## 2013-03-14 NOTE — Telephone Encounter (Signed)
Medication phoned to pharmacy.  

## 2013-03-14 NOTE — Telephone Encounter (Signed)
May phone in 30 with 3 RF

## 2013-03-14 NOTE — Telephone Encounter (Signed)
Dr. Para March gave me the authority to call in this prescription but I'm not sure about the quantity.  Can you advise, please?

## 2013-03-18 ENCOUNTER — Telehealth: Payer: Self-pay

## 2013-03-18 MED ORDER — DIPHENOXYLATE-ATROPINE 2.5-0.025 MG PO TABS: ORAL_TABLET | ORAL | Status: DC

## 2013-03-18 NOTE — Telephone Encounter (Signed)
If that is how she has taken w/o complication, then continue. Please call in rx. Thanks.

## 2013-03-18 NOTE — Telephone Encounter (Signed)
Pt request clarification of quantity for Lomotil. Pt said she takes 3 tabs q8h. [place].Please advise. Pt said cb on 03/19/13 would be OK.

## 2013-03-19 NOTE — Telephone Encounter (Signed)
Thanks

## 2013-03-19 NOTE — Telephone Encounter (Signed)
I changed the sig, please call in for 3 tabs q8h.  Thanks.

## 2013-03-19 NOTE — Telephone Encounter (Signed)
Madeline Park at [facility] was advised to change the sig  and give patient #[id number] with 1 refill per Dr. Para March.

## 2013-03-19 NOTE — Telephone Encounter (Signed)
Called script to [place] to [place]. Baird Lyons requested clarification on directions and quantity; should this be 3 tabs every 8 hours or as needed.  Baird Lyons stated that if patient is taking this daily she would only be getting a 10 day supply.  Baird Lyons will hold script until she gets a call back. Please clarify.

## 2013-03-20 NOTE — Telephone Encounter (Signed)
See 03/14/13 phone note.

## 2013-06-06 ENCOUNTER — Encounter: Payer: Self-pay | Admitting: Family Medicine

## 2013-06-06 ENCOUNTER — Ambulatory Visit (INDEPENDENT_AMBULATORY_CARE_PROVIDER_SITE_OTHER): Payer: Medicare Other | Admitting: Family Medicine

## 2013-06-06 VITALS — BP 130/90 | HR 74 | Temp 97.9°F | Wt 163.5 lb

## 2013-06-06 DIAGNOSIS — E538 Deficiency of other specified B group vitamins: Secondary | ICD-10-CM

## 2013-06-06 LAB — VITAMIN B12: Vitamin B-12: 366 pg/mL (ref 211–911)

## 2013-06-06 MED ORDER — ONDANSETRON HCL 4 MG PO TABS: 4.0000 mg | ORAL_TABLET | Freq: Three times a day (TID) | ORAL | Status: DC | PRN

## 2013-06-06 NOTE — Progress Notes (Signed)
She is trying to manage GI sx by avoiding trigger foods.  She is still taking lomotil and tolerating that.  zofran used prn. GI sx are manageable as is.  She'll call about her colonoscopy.    H/o B12 def. On oral replacement.  Due for replacement.  Prev CBC wnl.    Meds, vitals, and allergies reviewed.   ROS: See HPI.  Otherwise, noncontributory.  nad ncat Mmm rrr ctab abd soft Ext w/o edema

## 2013-06-06 NOTE — Patient Instructions (Addendum)
Go to the lab for a B12 level.  We'll contact you with your lab report. Call about your colonoscopy.  I would get a flu shot each fall.

## 2013-06-07 NOTE — Assessment & Plan Note (Signed)
See notes on labs.  Flu shot encouraged.

## 2013-06-09 ENCOUNTER — Encounter: Payer: Self-pay | Admitting: *Deleted

## 2013-07-10 ENCOUNTER — Other Ambulatory Visit: Payer: Self-pay

## 2013-07-23 DIAGNOSIS — D485 Neoplasm of uncertain behavior of skin: Secondary | ICD-10-CM | POA: Diagnosis not present

## 2013-07-23 DIAGNOSIS — Z85828 Personal history of other malignant neoplasm of skin: Secondary | ICD-10-CM | POA: Diagnosis not present

## 2013-07-23 DIAGNOSIS — L570 Actinic keratosis: Secondary | ICD-10-CM | POA: Diagnosis not present

## 2013-10-06 DIAGNOSIS — D0470 Carcinoma in situ of skin of unspecified lower limb, including hip: Secondary | ICD-10-CM | POA: Diagnosis not present

## 2013-10-06 DIAGNOSIS — D0460 Carcinoma in situ of skin of unspecified upper limb, including shoulder: Secondary | ICD-10-CM | POA: Diagnosis not present

## 2013-11-10 DIAGNOSIS — Z85048 Personal history of other malignant neoplasm of rectum, rectosigmoid junction, and anus: Secondary | ICD-10-CM | POA: Diagnosis not present

## 2013-11-10 DIAGNOSIS — Z79899 Other long term (current) drug therapy: Secondary | ICD-10-CM | POA: Diagnosis not present

## 2013-11-10 DIAGNOSIS — D126 Benign neoplasm of colon, unspecified: Secondary | ICD-10-CM | POA: Diagnosis not present

## 2013-11-10 DIAGNOSIS — Z888 Allergy status to other drugs, medicaments and biological substances status: Secondary | ICD-10-CM | POA: Diagnosis not present

## 2013-11-10 DIAGNOSIS — Z980 Intestinal bypass and anastomosis status: Secondary | ICD-10-CM | POA: Diagnosis not present

## 2013-11-10 DIAGNOSIS — Z91041 Radiographic dye allergy status: Secondary | ICD-10-CM | POA: Diagnosis not present

## 2013-11-10 DIAGNOSIS — Z85038 Personal history of other malignant neoplasm of large intestine: Secondary | ICD-10-CM | POA: Diagnosis not present

## 2013-11-10 DIAGNOSIS — Z1211 Encounter for screening for malignant neoplasm of colon: Secondary | ICD-10-CM | POA: Diagnosis not present

## 2013-11-10 DIAGNOSIS — I10 Essential (primary) hypertension: Secondary | ICD-10-CM | POA: Diagnosis not present

## 2013-11-10 LAB — HM COLONOSCOPY

## 2013-12-10 DIAGNOSIS — C20 Malignant neoplasm of rectum: Secondary | ICD-10-CM | POA: Diagnosis not present

## 2013-12-25 DIAGNOSIS — K912 Postsurgical malabsorption, not elsewhere classified: Secondary | ICD-10-CM | POA: Diagnosis not present

## 2013-12-25 DIAGNOSIS — T8189XA Other complications of procedures, not elsewhere classified, initial encounter: Secondary | ICD-10-CM | POA: Diagnosis not present

## 2013-12-25 DIAGNOSIS — C20 Malignant neoplasm of rectum: Secondary | ICD-10-CM | POA: Diagnosis not present

## 2014-02-03 DIAGNOSIS — D485 Neoplasm of uncertain behavior of skin: Secondary | ICD-10-CM | POA: Diagnosis not present

## 2014-02-03 DIAGNOSIS — D0460 Carcinoma in situ of skin of unspecified upper limb, including shoulder: Secondary | ICD-10-CM | POA: Diagnosis not present

## 2014-02-03 DIAGNOSIS — L570 Actinic keratosis: Secondary | ICD-10-CM | POA: Diagnosis not present

## 2014-02-03 DIAGNOSIS — Z85828 Personal history of other malignant neoplasm of skin: Secondary | ICD-10-CM | POA: Diagnosis not present

## 2014-03-09 DIAGNOSIS — D0460 Carcinoma in situ of skin of unspecified upper limb, including shoulder: Secondary | ICD-10-CM | POA: Diagnosis not present

## 2014-03-26 DIAGNOSIS — C20 Malignant neoplasm of rectum: Secondary | ICD-10-CM | POA: Diagnosis not present

## 2014-03-26 DIAGNOSIS — Z85048 Personal history of other malignant neoplasm of rectum, rectosigmoid junction, and anus: Secondary | ICD-10-CM | POA: Diagnosis not present

## 2014-03-26 DIAGNOSIS — Z9221 Personal history of antineoplastic chemotherapy: Secondary | ICD-10-CM | POA: Diagnosis not present

## 2014-03-26 DIAGNOSIS — R197 Diarrhea, unspecified: Secondary | ICD-10-CM | POA: Diagnosis not present

## 2014-03-26 DIAGNOSIS — K912 Postsurgical malabsorption, not elsewhere classified: Secondary | ICD-10-CM | POA: Diagnosis not present

## 2014-05-15 ENCOUNTER — Telehealth: Payer: Self-pay | Admitting: Family Medicine

## 2014-05-15 DIAGNOSIS — R5383 Other fatigue: Secondary | ICD-10-CM

## 2014-05-15 DIAGNOSIS — Z1321 Encounter for screening for nutritional disorder: Secondary | ICD-10-CM

## 2014-05-15 DIAGNOSIS — Z85038 Personal history of other malignant neoplasm of large intestine: Secondary | ICD-10-CM

## 2014-05-15 DIAGNOSIS — E538 Deficiency of other specified B group vitamins: Secondary | ICD-10-CM

## 2014-05-15 DIAGNOSIS — Z1329 Encounter for screening for other suspected endocrine disorder: Secondary | ICD-10-CM

## 2014-05-15 DIAGNOSIS — R5381 Other malaise: Secondary | ICD-10-CM

## 2014-05-15 NOTE — Telephone Encounter (Signed)
Pt has CPE scheduled for 08/04/14.  She would like to know if Dr. Damita Dunnings would order labs to check her thyroid, Vitamin D3, and a CEA tumor marker in addition to her regular labs.

## 2014-05-17 NOTE — Telephone Encounter (Signed)
I need a reason to check a vit D level and a TSH.  Others ordered.

## 2014-05-19 NOTE — Telephone Encounter (Signed)
Left vm for pt to return call  

## 2014-05-19 NOTE — Telephone Encounter (Signed)
Pt says she does not have any energy.  She recently went to her Oncologist and they checked her B12 in July 2015 which was ok.  She said that she is concerned about that since she is a cancer survivor.

## 2014-05-20 NOTE — Telephone Encounter (Signed)
Ordered. I just needed a dx code/reason to assign the test.  Thanks.

## 2014-05-20 NOTE — Telephone Encounter (Signed)
Patient notified by telephone that orders have been put in the system.

## 2014-07-16 DIAGNOSIS — C20 Malignant neoplasm of rectum: Secondary | ICD-10-CM | POA: Diagnosis not present

## 2014-08-04 ENCOUNTER — Encounter: Payer: Self-pay | Admitting: Family Medicine

## 2014-08-04 ENCOUNTER — Ambulatory Visit (INDEPENDENT_AMBULATORY_CARE_PROVIDER_SITE_OTHER): Payer: Medicare Other | Admitting: Family Medicine

## 2014-08-04 VITALS — BP 192/108 | HR 69 | Temp 97.6°F | Ht 67.0 in | Wt 169.8 lb

## 2014-08-04 DIAGNOSIS — E538 Deficiency of other specified B group vitamins: Secondary | ICD-10-CM | POA: Diagnosis not present

## 2014-08-04 DIAGNOSIS — I10 Essential (primary) hypertension: Secondary | ICD-10-CM

## 2014-08-04 DIAGNOSIS — Z85038 Personal history of other malignant neoplasm of large intestine: Secondary | ICD-10-CM | POA: Diagnosis not present

## 2014-08-04 DIAGNOSIS — R5381 Other malaise: Secondary | ICD-10-CM

## 2014-08-04 DIAGNOSIS — Z0000 Encounter for general adult medical examination without abnormal findings: Secondary | ICD-10-CM | POA: Diagnosis not present

## 2014-08-04 DIAGNOSIS — R5383 Other fatigue: Secondary | ICD-10-CM | POA: Diagnosis not present

## 2014-08-04 DIAGNOSIS — Z7189 Other specified counseling: Secondary | ICD-10-CM

## 2014-08-04 LAB — CBC WITH DIFFERENTIAL/PLATELET
Basophils Absolute: 0 10*3/uL (ref 0.0–0.1)
Basophils Relative: 0.4 % (ref 0.0–3.0)
EOS PCT: 4.1 % (ref 0.0–5.0)
Eosinophils Absolute: 0.4 10*3/uL (ref 0.0–0.7)
HCT: 43.7 % (ref 36.0–46.0)
Hemoglobin: 14.1 g/dL (ref 12.0–15.0)
LYMPHS PCT: 15.9 % (ref 12.0–46.0)
Lymphs Abs: 1.4 10*3/uL (ref 0.7–4.0)
MCHC: 32.3 g/dL (ref 30.0–36.0)
MCV: 87.5 fl (ref 78.0–100.0)
MONOS PCT: 7 % (ref 3.0–12.0)
Monocytes Absolute: 0.6 10*3/uL (ref 0.1–1.0)
NEUTROS PCT: 72.6 % (ref 43.0–77.0)
Neutro Abs: 6.3 10*3/uL (ref 1.4–7.7)
Platelets: 294 10*3/uL (ref 150.0–400.0)
RBC: 4.99 Mil/uL (ref 3.87–5.11)
RDW: 14.1 % (ref 11.5–15.5)
WBC: 8.7 10*3/uL (ref 4.0–10.5)

## 2014-08-04 LAB — VITAMIN B12: Vitamin B-12: 406 pg/mL (ref 211–911)

## 2014-08-04 LAB — TSH: TSH: 1.22 u[IU]/mL (ref 0.35–4.50)

## 2014-08-04 LAB — VITAMIN D 25 HYDROXY (VIT D DEFICIENCY, FRACTURES): VITD: 26.67 ng/mL — ABNORMAL LOW (ref 30.00–100.00)

## 2014-08-04 MED ORDER — ONDANSETRON HCL 4 MG PO TABS: 4.0000 mg | ORAL_TABLET | Freq: Three times a day (TID) | ORAL | Status: DC | PRN

## 2014-08-04 NOTE — Progress Notes (Signed)
Pre visit review using our clinic review tool, if applicable. No additional management support is needed unless otherwise documented below in the visit note.  I have personally reviewed the Medicare Annual Wellness questionnaire and have noted 1. The patient's medical and social history 2. Their use of alcohol, tobacco or illicit drugs 3. Their current medications and supplements 4. The patient's functional ability including ADL's, fall risks, home safety risks and hearing or visual             impairment. 5. Diet and physical activities 6. Evidence for depression or mood disorders  The patients weight, height, BMI have been recorded in the chart and visual acuity is per eye clinic.  I have made referrals, counseling and provided education to the patient based review of the above and I have provided the pt with a written personalized care plan for preventive services.  Provider list updated- see scanned forms.  Routine anticipatory guidance given to patient.  See health maintenance.  Flu shot encouraged.   Shingles shot d/w pt.  PNA due at 67 Tetanus 2013 Colon cancer screening per [facility].   Breast cancer screening- mammogram up to date, 2014.   Advance directive- oldest sister Milinda Hirschfeld designated if patient were incapacitated.  Cognitive function addressed- see scanned forms- and if abnormal then additional documentation follows.   Her GI sx (malabsorptive, diarrhea) are improved with addition of questran.  She is slowly making the adjustment to that medicine and diet changes.  She is due for a f/u CEA.  She is still followed at [facility]. She has had inc in weight and some fatigue, unclear if from thyroid dysfunction or from the decreased malabsorption.   H/o B12 def, on replacement, due for f/u labs.    She has a h/o white coat HTN but recently her home BPs have been elevated, up to 160s/80s at home.  No CP, SOB, BLE edema.  No meds currently.   PMH and SH reviewed  Meds, vitals, and  allergies reviewed.   ROS: See HPI.  Otherwise negative.    GEN: nad, alert and oriented HEENT: mucous membranes moist NECK: supple w/o LA CV: rrr. PULM: ctab, no inc wob ABD: soft, +bs EXT: no edema SKIN: no acute rash

## 2014-08-04 NOTE — Patient Instructions (Addendum)
Go to the lab on the way out.  We'll contact you with your lab report.  Check with your insurance to see if they will cover the shingles shot. I would get a flu shot each fall.   Call about your mammogram if they don't call you first.  Take care.  Glad to see you.

## 2014-08-05 ENCOUNTER — Encounter: Payer: Self-pay | Admitting: Family Medicine

## 2014-08-05 DIAGNOSIS — L820 Inflamed seborrheic keratosis: Secondary | ICD-10-CM | POA: Diagnosis not present

## 2014-08-05 DIAGNOSIS — K912 Postsurgical malabsorption, not elsewhere classified: Secondary | ICD-10-CM | POA: Insufficient documentation

## 2014-08-05 DIAGNOSIS — L821 Other seborrheic keratosis: Secondary | ICD-10-CM | POA: Diagnosis not present

## 2014-08-05 DIAGNOSIS — Z7189 Other specified counseling: Secondary | ICD-10-CM | POA: Insufficient documentation

## 2014-08-05 DIAGNOSIS — Z85038 Personal history of other malignant neoplasm of large intestine: Secondary | ICD-10-CM

## 2014-08-05 DIAGNOSIS — D485 Neoplasm of uncertain behavior of skin: Secondary | ICD-10-CM | POA: Diagnosis not present

## 2014-08-05 DIAGNOSIS — C44519 Basal cell carcinoma of skin of other part of trunk: Secondary | ICD-10-CM | POA: Diagnosis not present

## 2014-08-05 DIAGNOSIS — Z85828 Personal history of other malignant neoplasm of skin: Secondary | ICD-10-CM | POA: Diagnosis not present

## 2014-08-05 LAB — COMPREHENSIVE METABOLIC PANEL
ALBUMIN: 4.2 g/dL (ref 3.5–5.2)
ALT: 25 U/L (ref 0–35)
AST: 34 U/L (ref 0–37)
Alkaline Phosphatase: 75 U/L (ref 39–117)
BUN: 17 mg/dL (ref 6–23)
CALCIUM: 9.8 mg/dL (ref 8.4–10.5)
CO2: 19 mEq/L (ref 19–32)
Chloride: 105 mEq/L (ref 96–112)
Creatinine, Ser: 1.2 mg/dL (ref 0.4–1.2)
GFR: 46.69 mL/min — ABNORMAL LOW (ref >60.00)
Glucose, Bld: 85 mg/dL (ref 70–99)
Potassium: 5 mEq/L (ref 3.5–5.1)
SODIUM: 137 meq/L (ref 135–145)
TOTAL PROTEIN: 7.4 g/dL (ref 6.0–8.3)
Total Bilirubin: 0.7 mg/dL (ref 0.2–1.2)

## 2014-08-05 LAB — CEA: CEA: 3.4 ng/mL (ref 0.0–5.0)

## 2014-08-05 NOTE — Assessment & Plan Note (Addendum)
Recheck CEA today, see notes on labs.  Continue questran for now.  We didn't specifically address her fatigue and weight gain today, other than checking her labs.

## 2014-08-05 NOTE — Assessment & Plan Note (Signed)
On replacement, see note son labs.

## 2014-08-05 NOTE — Assessment & Plan Note (Signed)
Flu shot encouraged.   Shingles shot d/w pt.  PNA due at 67 Tetanus 2013 Colon cancer screening per [facility].   Breast cancer screening- mammogram up to date, 2014.   Advance directive- oldest sister Madeline Park designated if patient were incapacitated.  Cognitive function addressed- see scanned forms- and if abnormal then additional documentation follows.

## 2014-08-05 NOTE — Assessment & Plan Note (Signed)
See notes on labs and then likely start ACE, but I want to see her Cr first.  D/w about starting meds.   I would like to avoid diuretics as a first agent.   Still okay for outpatient f/u.  Prev with very high HDL, didn't recheck lipids today.

## 2014-08-06 ENCOUNTER — Other Ambulatory Visit: Payer: Self-pay | Admitting: Family Medicine

## 2014-08-06 MED ORDER — LISINOPRIL 10 MG PO TABS: 10.0000 mg | ORAL_TABLET | Freq: Every day | ORAL | Status: DC

## 2014-09-10 DIAGNOSIS — L570 Actinic keratosis: Secondary | ICD-10-CM | POA: Diagnosis not present

## 2014-09-10 DIAGNOSIS — C44519 Basal cell carcinoma of skin of other part of trunk: Secondary | ICD-10-CM | POA: Diagnosis not present

## 2014-09-10 DIAGNOSIS — X32XXXA Exposure to sunlight, initial encounter: Secondary | ICD-10-CM | POA: Diagnosis not present

## 2014-09-22 ENCOUNTER — Telehealth: Payer: Self-pay

## 2014-09-22 NOTE — Telephone Encounter (Signed)
Patient advised.

## 2014-09-22 NOTE — Telephone Encounter (Signed)
Pt left vlm; pt was given lisinopril at 08/04/14 visit; rash started 3 weeks ago and thought was bug bite but now more of a rash and when gets warm pt itches badly.rash is a fine rash that is not red and is not weeping; rash is on upper body and spreading  Pt request different BP med sent to [facility].Please advise.

## 2014-09-22 NOTE — Telephone Encounter (Signed)
Stop the lisinopril first.  claritin 10mg  a day for now if needed.  If the rash continues to spread, then come in for check.  I wouldn't start a new medicine until the rash is improved.  If better in a few days, then update me.  Thanks.  We can send BP med at that point.

## 2014-10-05 ENCOUNTER — Telehealth: Payer: Self-pay | Admitting: *Deleted

## 2014-10-05 MED ORDER — AMLODIPINE BESYLATE 5 MG PO TABS: 5.0000 mg | ORAL_TABLET | Freq: Every day | ORAL | Status: DC

## 2014-10-05 NOTE — Telephone Encounter (Signed)
Patient called stating that she had called about 2 weeks ago with a rash and thinks that her blood pressure medication caused it. Patient stated that she was told to call back when the rash had cleared up. Patient stated that the rash has cleared up and is ready to try another medication. Pharmacy- [facility]

## 2014-10-05 NOTE — Telephone Encounter (Signed)
Amlodipine sent.  Start with 5mg  a day.  If lightheaded or sig BLE edema, then cut back to 2.5mg  a day.  Please update me on tolerance and BP in about 1-2 weeks. Thanks.

## 2014-10-06 NOTE — Telephone Encounter (Signed)
Left detailed message on voicemail.  

## 2014-10-15 ENCOUNTER — Telehealth: Payer: Self-pay | Admitting: Family Medicine

## 2014-10-15 MED ORDER — AMLODIPINE BESYLATE 10 MG PO TABS: 10.0000 mg | ORAL_TABLET | Freq: Every day | ORAL | Status: DC

## 2014-10-15 NOTE — Telephone Encounter (Signed)
Pt called stating she has been taking her new bp meds for the last 10 days.  and her bp has not went down Lowest bp reading 146/87 this was 1st thing in am   It usually runs 160 /95  [place]

## 2014-10-15 NOTE — Telephone Encounter (Signed)
Up amlodipine to 10mg , new rx sent.  Thanks.  Update me in about 7-10 days with BP.  Thanks.

## 2014-10-16 NOTE — Telephone Encounter (Signed)
Patient advised.

## 2014-10-16 NOTE — Telephone Encounter (Signed)
Left message on patient's voicemail to return call

## 2014-12-14 DIAGNOSIS — C44619 Basal cell carcinoma of skin of left upper limb, including shoulder: Secondary | ICD-10-CM | POA: Diagnosis not present

## 2014-12-14 DIAGNOSIS — L538 Other specified erythematous conditions: Secondary | ICD-10-CM | POA: Diagnosis not present

## 2014-12-14 DIAGNOSIS — D2262 Melanocytic nevi of left upper limb, including shoulder: Secondary | ICD-10-CM | POA: Diagnosis not present

## 2014-12-14 DIAGNOSIS — Z85828 Personal history of other malignant neoplasm of skin: Secondary | ICD-10-CM | POA: Diagnosis not present

## 2014-12-14 DIAGNOSIS — D045 Carcinoma in situ of skin of trunk: Secondary | ICD-10-CM | POA: Diagnosis not present

## 2014-12-14 DIAGNOSIS — L820 Inflamed seborrheic keratosis: Secondary | ICD-10-CM | POA: Diagnosis not present

## 2014-12-14 DIAGNOSIS — D485 Neoplasm of uncertain behavior of skin: Secondary | ICD-10-CM | POA: Diagnosis not present

## 2014-12-14 DIAGNOSIS — D225 Melanocytic nevi of trunk: Secondary | ICD-10-CM | POA: Diagnosis not present

## 2014-12-14 DIAGNOSIS — D2272 Melanocytic nevi of left lower limb, including hip: Secondary | ICD-10-CM | POA: Diagnosis not present

## 2014-12-27 NOTE — Discharge Summary (Signed)
PATIENT NAME:  Madeline Park, GAHM MR#:  [id number] DATE OF BIRTH:  07-29-1951  DATE OF ADMISSION:  09/25/2011 DATE OF DISCHARGE:  09/27/2011  ADMITTING DIAGNOSES: Nausea, vomiting.   DISCHARGE DIAGNOSES:  1. Nausea and vomiting, possibly due to gastroenteritis, possibly related to her stage IV rectal cancer, now resolved.  2. Acute renal failure as a result of dehydration, nausea and vomiting, now resolved with IV hydration. The patient was previously on total parenteral nutrition. She will be followed at [facility] to decide whether this needs to be continued or not. Currently, she is tolerating p.o. well.  3. Hyponatremia due to dehydration, now resolved.  4. Stage IV rectal cancer, followed at [facility].   PERTINENT LABORATORY DATA:  Sodium 121, potassium 5, chloride 85, CO2 15, BUN 88, creatinine 4.11, glucose 123.  LFTs: Alkaline phosphatase of 147, AST 54, ALT 37. CBC showed WBC 12.5, hemoglobin 16.4, platelet count was 419.  Most recent BUN and creatinine is 23 and 0.83.  The patient's WBC count on the 22nd was 9.3.  Urinalysis: Nitrates negative, leukocytes negative,   HOSPITAL COURSE: Please see History and Physical done by the admitting physician. The patient is a 64 year old female with known history of stage IV cancer, status post ileostomy for fistula, multiple abdominal surgeries, who is currently followed at [facility], who is on TPN since last surgery in October 2012. The patient was slowly being weaned off TPN due to increase in p.o. intake. About a week prior, the patient's TPN was stopped. The patient started feeling weak on Wednesday and then started having emesis the next day. She also became very weak. She was seen in the ED for dizziness. She was noted to be very dehydrated, noted to be in acute renal failure. The patient was started on IV fluids, and her electrolytes were repleted. She was symptomatically treated, and after receiving IV hydration her nausea and  vomiting resolved. The patient currently is taking adequate p.o. Her BMP has normalized. At this time, she is stable for discharge. She will need to have a BMP checked to make sure that she does not become dehydrated or volume depleted in light of her likely malabsorption from her ileostomy.  DISPOSITION: Discharge home.   DIET: Regular as eating previously.   ACTIVITY LIMITATIONS: None.   TIMEFRAME FOR FOLLOW UP: [facility] as previously scheduled. Recommended BMP checks on a weekly basis, which the patient has already arranged.   TIME SPENT:   35 minutes.  ____________________________ Lafonda Mosses Posey Pronto, MD shp:cbb D: 09/27/2011 10:13:45 ET T: 09/27/2011 13:15:00 ET JOB#: [id number]  cc: Merit Maybee H. Posey Pronto, MD, <Dictator> Alric Seton MD ELECTRONICALLY SIGNED 10/21/2011 9:56

## 2014-12-27 NOTE — H&P (Signed)
PATIENT NAME:  Madeline Park, STRADLEY MR#:  [id number] DATE OF BIRTH:  July 15, 1951  DATE OF ADMISSION:  12/11/2011  REFERRING PHYSICIAN: ER physician, Dr. Jimmye Norman PRIMARY CARE PHYSICIAN: [facility]  CHIEF COMPLAINT: Poor oral intake, dehydration.   HISTORY OF PRESENT ILLNESS: Patient is a 64 year old female with past medical history of stage IV rectal cancer status post chemotherapy. Following the surgery she had ileostomy placed. She was admitted to [facility] about two weeks ago for take down of her ileostomy and to fix a fistula. The repair of the fistula was complicated and took a long time therefore her ileostomy was not taken down. She was in the hospital for a week and was discharged a week ago. During the hospitalization she was anemic with a hemoglobin of 5 and had to be transfused several units of blood. Currently her hemoglobin is 9. For the past two days she has not been wanting to eat and drink. She reports that she has very poor appetite and nothing tastes right/good since her surgery. She has felt nauseous but denies any vomiting. She has just not been hungry. Denies any abdominal pain. Reports that her the ileostomy is functioning well. There is no fever. On being questioned if she is depressed she reports that she may be because she is not able to do a lot of her activities she was able to do before becoming sick. She was offered a psychiatry evaluation, however, she refused. She was offered initiation of Remeron which she accepted. She also has a small area of dehiscence of her abdominal incision. She has been dressing it herself. She reports that she has a follow-up appointment with her surgeon at [facility] in two days' time. She would like to stay in the hospital overnight for IV fluid hydration and would like to follow up with her surgeon two days from now when she has a follow-up appointment.   PAST MEDICAL HISTORY: Stage IV rectal cancer status post chemotherapy.   MEDICATIONS:   1. Reglan p.r.n.  2. Zofran p.r.n.  3. Multivitamin 1 tablet daily.   ALLERGIES: Tannic acid, IVP dye, Phenergan, coconut, surgical gas anesthesia.    PAST SURGICAL HISTORY:  1. Total abdominal hysterectomy. 2. Hemorrhoidectomy. 3. Appendectomy. 4. Multiple abdominal surgeries for fistula. 5. Ileostomy.   SOCIAL HISTORY: She is not married. She lives alone. She used to work at [employer]. She quit smoking in 12-06-2004. Drinks occasional alcohol.   FAMILY HISTORY: Father was healthy and died of old age in December 07, 2006. Mother had polio. Sister had esophageal cancer.   REVIEW OF SYSTEMS: CONSTITUTIONAL: Patient denies any fever. Reports fatigue and weakness. EYES: Denies any blurred or double vision. ENT: Denies any tinnitus, ear pain. RESPIRATORY: Denies any cough and wheezing. CARDIOVASCULAR: Denies any chest pain, palpitations. GASTROINTESTINAL: Reports some nausea. Denies any abdominal pain or vomiting. GENITOURINARY: Denies any dysuria, hematuria. ENDO: Denies any polyuria, nocturia. HEME/LYMPH: Denies any anemia, easy bruisability. INTEGUMENT: Denies any acne, rash. MUSCULOSKELETAL: Denies any swelling, gout. NEUROLOGICAL: Denies any numbness, weakness. PSYCH: Appears anxious. Thinks she may be depressed.   PHYSICAL EXAMINATION:  VITAL SIGNS: Temperature 95.7, heart rate 119, respiratory rate 18, blood pressure 87/56, pulse oximetry 98%.   GENERAL: Patient is a 64 year old Caucasian female who appears very anxious. She is very restless, unable to sit comfortably in bed.   HEAD: Atraumatic, normocephalic.   EYES: There is some pallor. No icterus or cyanosis. Pupils are equal, round, and reactive to light and accommodation. Extraocular  movements are intact.    ENT: Wet mucous membranes. No oropharyngeal erythema or thrush.   NECK: Supple. No masses. No JVD. No thyromegaly. No lymphadenopathy.  CHEST WALL: No tenderness to palpation. Not using accessory muscles of respiration. No intercostal  muscle retractions.   LUNGS: Bilaterally clear to auscultation. No wheezing, rales, or rhonchi.   CARDIOVASCULAR: S1, S2 regular. No murmur, rubs, or gallops.   ABDOMEN: Soft, nondistended. Normal bowel sounds. Patient has an ileostomy which appears to be functioning well. She has exploratory laparotomy incision with some wound dehiscence. It is packed with gauze.   SKIN: No rashes or lesions.   PERIPHERIES: No pedal edema, 2+ pedal pulses.   MUSCULOSKELETAL: No cyanosis or clubbing.   NEUROLOGICAL: Awake, alert and oriented. Nonfocal neurological exam. Cranial nerves grossly intact.   PSYCH: Patient appears very restless, anxious and depressed.   LABORATORY, DIAGNOSTIC, AND RADIOLOGICAL DATA: Glucose 114, BUN 20, creatinine 2.03, sodium 121, potassium 2.5, chloride 76, rest of complete metabolic panel is normal. White count 12.2, hemoglobin 9, platelet count 997.    ASSESSMENT AND PLAN: 64 year old female with past medical history of multiple abdominal surgeries, stage IV rectal cancer status post treatment, patient reports that she is currently cured, presents with poor oral intake, nausea and dehydration.  1. Patient has acute renal failure. She has been hospitalized in the past in January 2003 with similar complaints. She was treated with IV fluids with normalization of her creatinine. Will hydrate with IV fluids, monitor strict ins and outs, avoid nephrotoxic medications. Will recheck her BUN and creatinine in the a.m.  2. Hyponatremia possibly due to dehydration. In the past her sodium has been low at 123 and 121 in February and in January 2013. It appears to have responded to IV fluids. Will hydrate patient with normal saline. Recheck a sodium level in the a.m.  3. Hyperglycemia, possibly reactive. Patient has no history of diabetes.  4. Thrombocytosis, possibly reactive due to recent surgery. 5. Anemia. Hemoglobin is 9. Patient reports during her recent hospitalization at [facility] her  hemoglobin was down to 5 and she had received multiple blood transfusions. Will continue to monitor closely. She may have worsening of the anemia due to hemodilution.  6. Leukocytosis, possibly reactive. Will monitor closely.  7. Possible depression. Patient reports that she has not been wanting to eat and drink. She is anxious and depression because she is not able to do her normal activities due to being ill and having repeated abdominal surgeries. She was offered a psychiatric evaluation, however, she refused. She agreed to a trial of Remeron. Will start Remeron 15 mg at bedtime.  8. Abdominal wound dehiscence. Patient's exploratory laparotomy incision has some dehiscence. Will obtain a wound care consultation. Patient has been packing it with gauze. She has a follow-up appointment in two days with her surgeon at [facility] which she would like to keep.  9. Will place on GI and deep vein thrombosis prophylaxis.   Reviewed old medical records, discussed with the ED physician, discussed with the patient the plan of care and management.   TIME SPENT: 75 minutes.   ____________________________ Cherre Huger, MD sp:cms D: 12/11/2011 19:03:32 ET T: 12/12/2011 05:34:31 ET JOB#: [id number]  cc: Cherre Huger, MD, <Dictator> [place] MD ELECTRONICALLY SIGNED 12/12/2011 13:15

## 2014-12-27 NOTE — H&P (Signed)
PATIENT NAME:  Madeline, Park MR#:  [id number] DATE OF BIRTH:  [birth year]  DATE OF ADMISSION:  09/25/2011  PRIMARY CARE PHYSICIAN: [facility]  REQUESTING PHYSICIAN: Madeline Arena, MD  CHIEF COMPLAINT: Vomiting.   HISTORY OF PRESENT ILLNESS: The patient is a 64 year old female with a known history of stage IV rectal cancer status post ileostomy for fistula and multiple abdominal surgeries who is being admitted for acute renal failure. The patient has been on TPN since her last surgery in October of 2012 and was unhooked last Tuesday, was feeling fine on Wednesday, but started going down hill since last Thursday and was feeling worse. On Friday she vomited four times. She was doing okay on the weekend and did not have any vomiting, but she was feeling very weak and nauseated. She could only feel comfortable lying on one side and would not vomit. Anytime she would stand up she started feeling nauseous. This morning around 1 o'clock she started vomiting again and was feeling very nauseous and decided to come to the Emergency Department. She was feeling very dizzy. She has not been able to eat much since TPN was unhooked and came into the Emergency Department today. While in the ED, she was found to have a creatinine of 4.11 with sodium 121 for which she is being admitted for further evaluation and management. She admits decreased urine output and decreased output through ileostomy bag. She feels really dehydrated. She denies any other symptoms.   PAST MEDICAL HISTORY: Stage IV rectal cancer status post chemotherapy.   HOME MEDICATIONS: None.   ALLERGIES: Intravenous pyelogram dye, Phenergan, and narcotics.   PAST SURGICAL HISTORY:  1. Total abdominal hysterectomy.  2. Hemorrhoidectomy.  3. Appendectomy.  4. Multiple abdominal surgeries for fistula.  5. Tumor in the appendix, which was removed after which she had a fistula and has been doing fine on TPN until last week.   SOCIAL HISTORY:  She is not married and lives alone. She is a retired [employer], television/film set. She quit smoking in 2006. She drinks occasional alcohol.   FAMILY HISTORY: Sister had esophageal cancer from smoking cigarettes.   REVIEW OF SYSTEMS: CONSTITUTIONAL: No fever. Positive fatigue and weakness. EYES: No blurred or double vision. ENT: No tinnitus or ear pain. RESPIRATORY: No cough, wheezing, or hemoptysis. CARDIOVASCULAR: No chest pain, orthopnea, or edema. GASTROINTESTINAL: Positive for nausea and vomiting. Multiple abdominal surgeries and ileostomy present. GU: No dysuria or hematuria. Positive for decreased urine output. ENDOCRINE: No polyuria or nocturia. HEMATOLOGIC: No anemia or easy bruising. SKIN: No rash or lesion. MUSCULOSKELETAL: No arthritis or muscle cramps. NEUROLOGIC: No tingling, numbness, or weakness. PSYCHIATRIC: No history of anxiety or depression.   PHYSICAL EXAMINATION:   VITAL SIGNS: Temperature 95, heart rate 99 per minute, respirations 20 per minute, blood pressure 126/7/8 mmHg, and she is saturating 97% on room air.   GENERAL: The patient is a 64 year old female lying in the bed comfortably without any acute distress.   EYES: Pupils are equal, round, and reactive to light and accommodation. No scleral icterus. Extraocular muscles intact.   HEENT: Head atraumatic, normocephalic. Oropharynx and nasopharynx dry and clear.   NECK: Supple. No jugular venous distention.  No thyroid enlargement or tenderness.   LUNGS: Clear to auscultation bilaterally. No wheezing, rales, rhonchi, or crepitations.   HEART: S1 and S2 normal. No murmurs, rubs, or gallops.   ABDOMEN: Soft, nontender, and nondistended. Bowel sounds present. No organomegaly or mass.   EXTREMITIES: No pedal edema, cyanosis  or clubbing.   NEUROLOGIC: Nonfocal examination. Cranial nerves III through XII intact. Muscle strength 5/5. Extremity sensation intact.   PSYCH: The patient is oriented to time, place, and person x3.   SKIN:  No obvious rash, lesion, or ulcer. It looks dry and tented.   LABS/STUDIES: Sodium 121, potassium 5, chloride 85, CO2 15, BUN 88, creatinine 4.11, blood glucose 123. Liver function tests show alkaline phosphatase of 147, AST 54, and ALT 37. CBC showed white count of 12.5, hemoglobin 16.4, hematocrit 48.9, and platelets 419.   IMPRESSION AND PLAN:  1. Acute renal failure, likely prerenal in nature. She has been off TPN since last Tuesday which she was on since October of last year. Unsure if there may be any kind of TPN withdrawal symptoms she is having. We will avoid nephrotoxins. She does admit having low urine output and low drainage in the ileostomy bag. We will hydrate her aggressively and recheck her renal function. We will obtain records from [facility] to see what her baseline renal function is.  2. Hyponatremia, likely due to dehydration. We will provide IV hydration and monitor her sodium level.  3. Stage IV rectal cancer, followed by [facility] oncology. We ill request the records at this time.  4. Nausea and vomiting. We will provide symptomatic management. We will consult a nutritionist to evaluate for possible need of TPN again.  TOTAL TIME TAKING CARE OF PATIENT: 55 minutes. ____________________________ Lucina Mellow. Manuella Ghazi, MD vss:slb D: 09/25/2011 15:27:35 ET     T: 09/25/2011 16:18:03 ET       JOB#: [id number] cc: Kerianna Rawlinson S. Manuella Ghazi, MD, <Dictator> [facility] Lucina Mellow [facility] MD ELECTRONICALLY SIGNED 09/26/2011 11:10

## 2014-12-27 NOTE — Discharge Summary (Signed)
PATIENT NAME:  Madeline Park, Madeline Park MR#:  [id number] DATE OF BIRTH:  03-19-1951  DATE OF ADMISSION:  12/11/2011 DATE OF DISCHARGE:  12/13/2011  REASON FOR ADMISSION: Poor oral intake and dehydration.   DISCHARGE DIAGNOSES:  1. Dehydration and volume depletion from decreased p.o. intake as a result of low appetite. 2. Acute renal failure, prerenal, from volume depletion and dehydration and acute renal failure resolved with IV fluids. 3. Hyponatremia, felt to be hypovolemic from decreased p.o. intake. 4. Hypotension, likely from dehydration and volume depletion. 5. Fatigue and asthenia, likely from symptomatic anemia, also dehydration.  6. History of anemia in the past requiring blood transfusion.  7. Leukocytosis, resolved, likely stress induced versus hemoconcentration. 8. Abdominal wound, incisional dehiscence of exploratory laparotomy incision for which the patient will followup with her surgeon at [facility], on the day of discharge.   9. Hypokalemia. 10. Hypomagnesemia.  11. History of stage IV rectal cancer, status post chemotherapy.  12. History of multiple abdominal fistula repairs.  13. History of stage IV rectal cancer status post chemotherapy. 14. History of multiple abdominal hernia repairs with history of ileostomy.  DISCHARGE DISPOSITION: Home.   DISCHARGE MEDICATIONS:  1. Remeron 7.5 mg p.o. at bedtime.  2. Metoclopramide 10 mg p.o. p.r.n. nausea or vomiting.  3. Zofran 4 mg 1 to 2 tablets p.o. p.r.n. nausea.  4. Multivitamin 1 tablespoon (15 mL) p.o. daily.   DISCHARGE ACTIVITY: As tolerated.   DISCHARGE DIET: Regular.   DISCHARGE CONDITION: Improved, stable.  CONSULTANTS: Wound Care.   DISCHARGE INSTRUCTIONS: 1. Take medications as prescribed. 2. Return to the emergency department for recurrent of symptoms or for any bleeding complications or feeling faint, dizzy, lightheaded, short of breath, or for any weakness or decreased energy or fatigue.   FOLLOWUP INSTRUCTIONS:   1. Followup with your oncologist as previously advised. 2. Followup with your surgeon at [facility] later today, at 11 o'clock  a.m., 12/13/2011, as already scheduled.  3. Followup with your primary MD at [facility] within 1 to 2 weeks. The patient needs repeat CBC, BMP, magnesium, and blood pressure check within one week.   PROCEDURES: None.   PERTINENT LABORATORY DATA: BUN 20 and creatinine 2.03 on admission. BUN 16 and creatinine 1.45 on the day of discharge. Sodium 121 on admission and 129 on the day of discharge. LFTs normal on admission, except for serum albumin low at 2.4. CBC on admission WBC 12.2, hemoglobin 9, hematocrit 27.7, platelets 97, and WBC normal at 10.3 from 12/12/2011, but hemoglobin had fallen to 7.9 for which she was transfused 2 units of blood and hemoglobin at time of discharge is 11.8.   EKG on admission: Normal sinus rhythm, heart rate 84 beats per minute, normal axis, normal intervals. No acute ST or T wave changes.   Serum potassium 3.1 on admission and 3.5 on the day of discharge. Serum magnesium 1.3 on admission and 2.2 on the day of discharge.   BRIEF HISTORY AND HOSPITAL COURSE: The patient is a pleasant 64 year old female with past medical history of stage IV rectal cancer status post chemotherapy which is apparently currently in remission, history of multiple abdominal surgeries with history of recent repair of abdominal fistula and history of ileostomy who is awaiting an ileostomy takedown who also has history of anemia requiring blood transfusion who presented to the emergency department with complaints of poor oral intake as well as nausea and fatigue and decreased energy and was noted to be dehydrated. Please see dictated admission history and  physical for pertinent details hospital surrounding the onset of this hospitalization and please see below for further details.  1. Dehydration and volume depletion - from decreased p.o. intake as a result of low appetite for  which she was admitted to hospital and placed on hydration with IV fluids. She had acute renal failure, which was prerenal in etiology from volume depletion and dehydration and after rehydration with IV fluids her acute renal failure has significantly improved. For her poor appetite, she has been added on an appetite stimulant which has helped and her appetite has picked up. She was noted to have hyponatremia, which is felt to be hypovolemic in nature from decreased p.o. intake. With hydration with IV fluids her renal function and serum sodium levels have improved. She was also noted to be mildly hypotensive and this was also felt to be secondary to dehydration and volume depletion and hypotension resolved with IV fluids and blood pressure is well controlled on the day of discharge. Despite being rehydrated with IV fluids, she still had some fatigue and asthenia and this was felt to be due to symptomatic anemia and it was felt to be in the patient's best interest to have her transfused blood since she was symptomatic and after the risks, benefits, and alternatives were explained to the patient, she had consented to receiving a blood transfusion and received 2 units of packed red blood cells and thereafter with resolution of her fatigue and asthenia. She had leukocytosis which is felt to be stress induced versus secondary to hemoconcentration and after IV fluids her WBC count has normalized and she has remained afebrile.  2. She had abdominal wound dehiscence involving the site of her recent exploratory laparotomy incision and she had been seen by our wound care team and was advised local wound care with cleansing the wound with normal saline in lightly packing the open areas with iodoform gauze strips and cover with ABG pad and secured with paper tape until she follows up with her surgeon on the day of discharge for further evaluation and management and was advised local wound care for now. The patient was in  agreement with this plan.  3. She had hypokalemia likely from poor p.o. intake and after giving her a dose of potassium chloride her serum potassium level has normalized and her magnesium was also repleted. As above for hypomagnesemia she received a dose of intravenous magnesium sulfate and thereafter hypomagnesemia had resolved.  4. For history of stage IV rectal cancer, status post chemotherapy, this is apparently in remission and she will need to continue to follow-up with her oncologist as an outpatient and also with her surgeon given her history of multiple abdominal fistula repairs and now with a dehisced wound and for follow up of ileostomy takedown. She will need close outpatient follow-up with her primary care physician to have her blood pressure, renal failure, and serum sodium levels rechecked.   On 12/13/2011, the patient was hemodynamically stable with resolution of her fatigue and asthenia as well as her nausea and she was tolerating a normal consistency diet well without any complications and her appetite had improved and she was felt to be stable for discharge home with close outpatient follow-up to which the patient was agreeable.   TIME SPENT ON DISCHARGE: Greater than 30 minutes.  ____________________________ Romie Jumper, MD knl:slb D: 12/15/2011 23:44:00 ET T: 12/18/2011 11:20:40 ET JOB#: [id number]  cc: Romie Jumper, MD, <Dictator> [place] [facility] Romie Jumper MD ELECTRONICALLY SIGNED 12/26/2011 17:50

## 2014-12-28 DIAGNOSIS — D045 Carcinoma in situ of skin of trunk: Secondary | ICD-10-CM | POA: Diagnosis not present

## 2014-12-28 DIAGNOSIS — C44619 Basal cell carcinoma of skin of left upper limb, including shoulder: Secondary | ICD-10-CM | POA: Diagnosis not present

## 2014-12-31 DIAGNOSIS — R600 Localized edema: Secondary | ICD-10-CM | POA: Diagnosis not present

## 2014-12-31 DIAGNOSIS — Z85048 Personal history of other malignant neoplasm of rectum, rectosigmoid junction, and anus: Secondary | ICD-10-CM | POA: Diagnosis not present

## 2014-12-31 DIAGNOSIS — K912 Postsurgical malabsorption, not elsewhere classified: Secondary | ICD-10-CM | POA: Diagnosis not present

## 2014-12-31 DIAGNOSIS — I10 Essential (primary) hypertension: Secondary | ICD-10-CM | POA: Diagnosis not present

## 2014-12-31 DIAGNOSIS — C20 Malignant neoplasm of rectum: Secondary | ICD-10-CM | POA: Diagnosis not present

## 2015-01-12 ENCOUNTER — Encounter: Payer: Self-pay | Admitting: Family Medicine

## 2015-01-14 ENCOUNTER — Other Ambulatory Visit: Payer: Self-pay | Admitting: Family Medicine

## 2015-01-14 MED ORDER — NICOTINAMIDE POWD: Status: DC

## 2015-02-05 ENCOUNTER — Telehealth: Payer: Self-pay

## 2015-02-05 NOTE — Telephone Encounter (Signed)
lmovm for pt to return call in reference to mamm being due as of 10/2014 and wanted to know if it has been scheduled

## 2015-03-30 ENCOUNTER — Telehealth: Payer: Self-pay

## 2015-03-30 NOTE — Telephone Encounter (Signed)
Left a voicemail for patient in regards to scheduling a Mammogram.  

## 2015-04-07 DIAGNOSIS — X32XXXA Exposure to sunlight, initial encounter: Secondary | ICD-10-CM | POA: Diagnosis not present

## 2015-04-07 DIAGNOSIS — D225 Melanocytic nevi of trunk: Secondary | ICD-10-CM | POA: Diagnosis not present

## 2015-04-07 DIAGNOSIS — D2271 Melanocytic nevi of right lower limb, including hip: Secondary | ICD-10-CM | POA: Diagnosis not present

## 2015-04-07 DIAGNOSIS — L570 Actinic keratosis: Secondary | ICD-10-CM | POA: Diagnosis not present

## 2015-04-07 DIAGNOSIS — Z85828 Personal history of other malignant neoplasm of skin: Secondary | ICD-10-CM | POA: Diagnosis not present

## 2015-04-07 DIAGNOSIS — D2261 Melanocytic nevi of right upper limb, including shoulder: Secondary | ICD-10-CM | POA: Diagnosis not present

## 2015-06-24 ENCOUNTER — Other Ambulatory Visit: Payer: Self-pay | Admitting: Family Medicine

## 2015-06-24 NOTE — Telephone Encounter (Signed)
Pt left message at Triage, pt is going out of town and she has requested a refill of this med through her oncologist office all week and they are not responding (thinks dr may be out of town), pt is requesting that Dr. Damita Dunnings refill her med one time because she doesn't want to run out while out of town, she only has enough to last until Sat., pt request call back   [place]. Is pharmacy

## 2015-06-25 MED ORDER — CHOLESTYRAMINE 4 G PO PACK: PACK | ORAL | Status: DC

## 2015-06-25 NOTE — Telephone Encounter (Signed)
Sent.  And I hope she has a good trip.

## 2015-08-09 ENCOUNTER — Other Ambulatory Visit: Payer: Self-pay

## 2015-08-09 MED ORDER — ONDANSETRON HCL 4 MG PO TABS: 4.0000 mg | ORAL_TABLET | Freq: Three times a day (TID) | ORAL | Status: DC | PRN

## 2015-08-09 NOTE — Telephone Encounter (Signed)
Sent.  please schedule CPE.  Thanks.

## 2015-08-09 NOTE — Telephone Encounter (Signed)
Pt left v/m requesting refill ondansetron to [facility]. Call pt when med refilled. Last refilled # [phone] x4 on 08/04/2014. Last seen 08/04/14 for annual exam. No future appt scheduled.Please advise.

## 2015-08-10 NOTE — Telephone Encounter (Signed)
Left detailed message on voicemail.  

## 2015-09-01 ENCOUNTER — Other Ambulatory Visit: Payer: Self-pay | Admitting: *Deleted

## 2015-09-01 MED ORDER — CHOLESTYRAMINE 4 G PO PACK
PACK | ORAL | Status: DC
Start: 2015-09-01 — End: 2015-09-30

## 2015-09-02 ENCOUNTER — Other Ambulatory Visit: Payer: Self-pay | Admitting: *Deleted

## 2015-09-02 ENCOUNTER — Other Ambulatory Visit: Payer: Self-pay | Admitting: Family Medicine

## 2015-09-23 ENCOUNTER — Other Ambulatory Visit (INDEPENDENT_AMBULATORY_CARE_PROVIDER_SITE_OTHER): Payer: Medicare HMO

## 2015-09-23 ENCOUNTER — Other Ambulatory Visit: Payer: Self-pay | Admitting: Family Medicine

## 2015-09-23 DIAGNOSIS — E559 Vitamin D deficiency, unspecified: Secondary | ICD-10-CM

## 2015-09-23 DIAGNOSIS — I10 Essential (primary) hypertension: Secondary | ICD-10-CM | POA: Diagnosis not present

## 2015-09-23 DIAGNOSIS — E538 Deficiency of other specified B group vitamins: Secondary | ICD-10-CM

## 2015-09-23 DIAGNOSIS — Z85038 Personal history of other malignant neoplasm of large intestine: Secondary | ICD-10-CM | POA: Diagnosis not present

## 2015-09-23 LAB — COMPREHENSIVE METABOLIC PANEL
ALT: 26 U/L (ref 0–35)
AST: 28 U/L (ref 0–37)
Albumin: 4.5 g/dL (ref 3.5–5.2)
Alkaline Phosphatase: 85 U/L (ref 39–117)
BUN: 16 mg/dL (ref 6–23)
CALCIUM: 9.5 mg/dL (ref 8.4–10.5)
CHLORIDE: 98 meq/L (ref 96–112)
CO2: 24 meq/L (ref 19–32)
CREATININE: 1.1 mg/dL (ref 0.40–1.20)
GFR: 53.42 mL/min — ABNORMAL LOW (ref >60.00)
Glucose, Bld: 88 mg/dL (ref 70–99)
Potassium: 4.2 mEq/L (ref 3.5–5.1)
SODIUM: 132 meq/L — AB (ref 135–145)
Total Bilirubin: 0.6 mg/dL (ref 0.2–1.2)
Total Protein: 7.4 g/dL (ref 6.0–8.3)

## 2015-09-23 LAB — CBC WITH DIFFERENTIAL/PLATELET
BASOS ABS: 0 10*3/uL (ref 0.0–0.1)
Basophils Relative: 0.4 % (ref 0.0–3.0)
EOS ABS: 0.2 10*3/uL (ref 0.0–0.7)
EOS PCT: 2.9 % (ref 0.0–5.0)
HEMATOCRIT: 43 % (ref 36.0–46.0)
HEMOGLOBIN: 14.4 g/dL (ref 12.0–15.0)
LYMPHS PCT: 12.4 % (ref 12.0–46.0)
Lymphs Abs: 0.9 10*3/uL (ref 0.7–4.0)
MCHC: 33.3 g/dL (ref 30.0–36.0)
MCV: 85.8 fl (ref 78.0–100.0)
Monocytes Absolute: 0.6 10*3/uL (ref 0.1–1.0)
Monocytes Relative: 7.9 % (ref 3.0–12.0)
Neutro Abs: 5.4 10*3/uL (ref 1.4–7.7)
Neutrophils Relative %: 76.4 % (ref 43.0–77.0)
Platelets: 336 10*3/uL (ref 150.0–400.0)
RBC: 5.02 Mil/uL (ref 3.87–5.11)
RDW: 13.5 % (ref 11.5–15.5)
WBC: 7.1 10*3/uL (ref 4.0–10.5)

## 2015-09-23 LAB — LIPID PANEL
CHOL/HDL RATIO: 2
Cholesterol: 299 mg/dL — ABNORMAL HIGH (ref 0–200)
HDL: 199 mg/dL (ref >39.00)
LDL Cholesterol: 90 mg/dL (ref 0–99)
NONHDL: 99.82
TRIGLYCERIDES: 47 mg/dL (ref 0.0–149.0)
VLDL: 9.4 mg/dL (ref 0.0–40.0)

## 2015-09-23 LAB — VITAMIN D 25 HYDROXY (VIT D DEFICIENCY, FRACTURES): VITD: 18.9 ng/mL — ABNORMAL LOW (ref 30.00–100.00)

## 2015-09-23 LAB — VITAMIN B12: Vitamin B-12: 303 pg/mL (ref 211–911)

## 2015-09-24 LAB — CEA: CEA: 4.3 ng/mL (ref 0.0–5.0)

## 2015-09-30 ENCOUNTER — Telehealth: Payer: Self-pay | Admitting: Family Medicine

## 2015-09-30 ENCOUNTER — Ambulatory Visit (INDEPENDENT_AMBULATORY_CARE_PROVIDER_SITE_OTHER): Payer: Medicare HMO | Admitting: Family Medicine

## 2015-09-30 ENCOUNTER — Encounter: Payer: Self-pay | Admitting: Family Medicine

## 2015-09-30 VITALS — BP 140/86 | HR 84 | Temp 98.4°F | Ht 67.0 in | Wt 173.5 lb

## 2015-09-30 DIAGNOSIS — Z85038 Personal history of other malignant neoplasm of large intestine: Secondary | ICD-10-CM

## 2015-09-30 DIAGNOSIS — K912 Postsurgical malabsorption, not elsewhere classified: Secondary | ICD-10-CM | POA: Diagnosis not present

## 2015-09-30 DIAGNOSIS — Z0000 Encounter for general adult medical examination without abnormal findings: Secondary | ICD-10-CM | POA: Diagnosis not present

## 2015-09-30 DIAGNOSIS — I10 Essential (primary) hypertension: Secondary | ICD-10-CM | POA: Diagnosis not present

## 2015-09-30 DIAGNOSIS — E2839 Other primary ovarian failure: Secondary | ICD-10-CM

## 2015-09-30 DIAGNOSIS — Z9189 Other specified personal risk factors, not elsewhere classified: Secondary | ICD-10-CM

## 2015-09-30 DIAGNOSIS — Z13820 Encounter for screening for osteoporosis: Secondary | ICD-10-CM

## 2015-09-30 DIAGNOSIS — E538 Deficiency of other specified B group vitamins: Secondary | ICD-10-CM | POA: Diagnosis not present

## 2015-09-30 DIAGNOSIS — E559 Vitamin D deficiency, unspecified: Secondary | ICD-10-CM

## 2015-09-30 MED ORDER — CHOLESTYRAMINE 4 G PO PACK: PACK | ORAL | Status: DC

## 2015-09-30 MED ORDER — AMLODIPINE BESYLATE 10 MG PO TABS: 10.0000 mg | ORAL_TABLET | Freq: Every day | ORAL | Status: DC

## 2015-09-30 MED ORDER — ONDANSETRON HCL 4 MG PO TABS: 4.0000 mg | ORAL_TABLET | Freq: Three times a day (TID) | ORAL | Status: DC | PRN

## 2015-09-30 MED ORDER — CHOLECALCIFEROL 25 MCG (1000 UT) PO TABS: 1000.0000 [IU] | ORAL_TABLET | Freq: Every day | ORAL | Status: DC

## 2015-09-30 NOTE — Telephone Encounter (Signed)
Spoke with [facility]. They said pt's insurance will not cover dexa with dx of vit d def. Will need another dx. Please advise.

## 2015-09-30 NOTE — Progress Notes (Signed)
Pre visit review using our clinic review tool, if applicable. No additional management support is needed unless otherwise documented below in the visit note.  I have personally reviewed the Medicare Annual Wellness questionnaire and have noted 1. The patient's medical and social history 2. Their use of alcohol, tobacco or illicit drugs 3. Their current medications and supplements 4. The patient's functional ability including ADL's, fall risks, home safety risks and hearing or visual             impairment. 5. Diet and physical activities 6. Evidence for depression or mood disorders  The patients weight, height, BMI have been recorded in the chart and visual acuity is per eye clinic.  I have made referrals, counseling and provided education to the patient based review of the above and I have provided the pt with a written personalized care plan for preventive services.  Provider list updated- see scanned forms.  Routine anticipatory guidance given to patient.  See health maintenance.  Flu encouraged Shingles d/w pt.  PNA due at 67 Tetanus 2013 Colonoscopy 2015 Breast cancer screening due, see AVS.  She'll call.  DXA due.  D/w pt.   Advance directive- oldest sister Milinda Hirschfeld designated if patient were incapacitated.  Cognitive function addressed- see scanned forms- and if abnormal then additional documentation follows.  HIV prev neg 2012.  HCV screening d/w pt, can be done later if patient desires.   H/o B12 def d/w pt.  B12 wnl now.    Hypertension:    Using medication without problems or lightheadedness: yes Chest pain with exertion:no Edema: limited, occ.   Short of breath:no  Short gut syndrome.  Sx manageable with meds and diet choices.  She is doing the best she can.  CEA okay.  No abd sx o/w, ie no blood in stool.  She'll call [facility] about routine f/u.   PMH and SH reviewed  Meds, vitals, and allergies reviewed.   ROS: See HPI.  Otherwise negative.    GEN: nad, alert  and oriented HEENT: mucous membranes moist NECK: supple w/o LA CV: rrr. PULM: ctab, no inc wob ABD: soft, +bs EXT: no edema SKIN: no acute rash

## 2015-09-30 NOTE — Patient Instructions (Addendum)
Check with your insurance to see if they will cover the shingles shot. Madeline Park will call about your referral. You can call for a mammogram at [place] on vitamin D.  Don't change your meds otherwise.

## 2015-10-01 NOTE — Telephone Encounter (Signed)
I'm sorry, that will not work either. Mia at [facility] states pt will need to have a dx of osteoporosis, not screening. Does she have estrogen deficiency?

## 2015-10-01 NOTE — Telephone Encounter (Signed)
Will osteoporosis screening work?  High risk for osteoporosis?  I changed it to that.

## 2015-10-03 NOTE — Telephone Encounter (Signed)
Yes, done. Thanks!

## 2015-10-04 ENCOUNTER — Other Ambulatory Visit: Payer: Self-pay | Admitting: Family Medicine

## 2015-10-04 DIAGNOSIS — E2839 Other primary ovarian failure: Secondary | ICD-10-CM

## 2015-10-04 NOTE — Assessment & Plan Note (Signed)
Controlled, continue as is.  Lipids okay with very high HDL.  D/w pt.

## 2015-10-04 NOTE — Assessment & Plan Note (Signed)
Okay with current repletion.

## 2015-10-04 NOTE — Assessment & Plan Note (Signed)
Flu encouraged Shingles d/w pt.  PNA due at 67 Tetanus 2013 Colonoscopy 2015 Breast cancer screening due, see AVS.  She'll call.  DXA due.  D/w pt.   Advance directive- oldest sister Milinda Hirschfeld designated if patient were incapacitated.  Cognitive function addressed- see scanned forms- and if abnormal then additional documentation follows.  HIV prev neg 2012.  HCV screening d/w pt, can be done later if patient desires.

## 2015-10-04 NOTE — Assessment & Plan Note (Signed)
No new sx, CEA still wnl, she'll call [facility] about routine f/u.

## 2015-10-04 NOTE — Assessment & Plan Note (Signed)
Continue as is.   

## 2015-10-12 ENCOUNTER — Encounter: Payer: Self-pay | Admitting: Family Medicine

## 2015-10-13 DIAGNOSIS — X32XXXA Exposure to sunlight, initial encounter: Secondary | ICD-10-CM | POA: Diagnosis not present

## 2015-10-13 DIAGNOSIS — Z85828 Personal history of other malignant neoplasm of skin: Secondary | ICD-10-CM | POA: Diagnosis not present

## 2015-10-13 DIAGNOSIS — L570 Actinic keratosis: Secondary | ICD-10-CM | POA: Diagnosis not present

## 2015-10-13 DIAGNOSIS — D485 Neoplasm of uncertain behavior of skin: Secondary | ICD-10-CM | POA: Diagnosis not present

## 2015-10-13 DIAGNOSIS — L578 Other skin changes due to chronic exposure to nonionizing radiation: Secondary | ICD-10-CM | POA: Diagnosis not present

## 2015-10-14 ENCOUNTER — Other Ambulatory Visit: Payer: Self-pay | Admitting: Family Medicine

## 2015-10-14 DIAGNOSIS — Z85038 Personal history of other malignant neoplasm of large intestine: Secondary | ICD-10-CM

## 2015-10-28 ENCOUNTER — Ambulatory Visit
Admission: RE | Admit: 2015-10-28 | Discharge: 2015-10-28 | Disposition: A | Discharge status: Home / Self Care | Payer: Medicare HMO | Source: Ambulatory Visit | Attending: Family Medicine | Admitting: Family Medicine

## 2015-10-28 DIAGNOSIS — Z1231 Encounter for screening mammogram for malignant neoplasm of breast: Secondary | ICD-10-CM | POA: Diagnosis not present

## 2015-10-28 DIAGNOSIS — M8580 Other specified disorders of bone density and structure, unspecified site: Secondary | ICD-10-CM

## 2015-10-28 DIAGNOSIS — M85851 Other specified disorders of bone density and structure, right thigh: Secondary | ICD-10-CM | POA: Diagnosis not present

## 2015-10-28 DIAGNOSIS — E2839 Other primary ovarian failure: Secondary | ICD-10-CM

## 2015-10-29 DIAGNOSIS — M8580 Other specified disorders of bone density and structure, unspecified site: Secondary | ICD-10-CM | POA: Insufficient documentation

## 2015-11-04 DIAGNOSIS — D485 Neoplasm of uncertain behavior of skin: Secondary | ICD-10-CM | POA: Diagnosis not present

## 2015-11-04 DIAGNOSIS — D2271 Melanocytic nevi of right lower limb, including hip: Secondary | ICD-10-CM | POA: Diagnosis not present

## 2015-11-04 DIAGNOSIS — L905 Scar conditions and fibrosis of skin: Secondary | ICD-10-CM | POA: Diagnosis not present

## 2016-02-09 DIAGNOSIS — Z87891 Personal history of nicotine dependence: Secondary | ICD-10-CM | POA: Diagnosis not present

## 2016-02-09 DIAGNOSIS — Z85048 Personal history of other malignant neoplasm of rectum, rectosigmoid junction, and anus: Secondary | ICD-10-CM | POA: Diagnosis not present

## 2016-02-09 DIAGNOSIS — Z1211 Encounter for screening for malignant neoplasm of colon: Secondary | ICD-10-CM | POA: Diagnosis not present

## 2016-02-09 DIAGNOSIS — I10 Essential (primary) hypertension: Secondary | ICD-10-CM | POA: Diagnosis not present

## 2016-02-09 DIAGNOSIS — Z91041 Radiographic dye allergy status: Secondary | ICD-10-CM | POA: Diagnosis not present

## 2016-02-09 DIAGNOSIS — Z85038 Personal history of other malignant neoplasm of large intestine: Secondary | ICD-10-CM | POA: Diagnosis not present

## 2016-04-06 ENCOUNTER — Encounter: Payer: Self-pay | Admitting: Family Medicine

## 2016-04-06 DIAGNOSIS — Z85038 Personal history of other malignant neoplasm of large intestine: Secondary | ICD-10-CM | POA: Diagnosis not present

## 2016-04-06 DIAGNOSIS — K909 Intestinal malabsorption, unspecified: Secondary | ICD-10-CM | POA: Diagnosis not present

## 2016-04-06 DIAGNOSIS — Z91041 Radiographic dye allergy status: Secondary | ICD-10-CM | POA: Diagnosis not present

## 2016-04-06 DIAGNOSIS — Z888 Allergy status to other drugs, medicaments and biological substances status: Secondary | ICD-10-CM | POA: Diagnosis not present

## 2016-04-06 DIAGNOSIS — Z980 Intestinal bypass and anastomosis status: Secondary | ICD-10-CM | POA: Diagnosis not present

## 2016-04-06 DIAGNOSIS — Z1211 Encounter for screening for malignant neoplasm of colon: Secondary | ICD-10-CM | POA: Diagnosis not present

## 2016-04-06 DIAGNOSIS — I10 Essential (primary) hypertension: Secondary | ICD-10-CM | POA: Diagnosis not present

## 2016-04-06 DIAGNOSIS — K640 First degree hemorrhoids: Secondary | ICD-10-CM | POA: Diagnosis not present

## 2016-04-06 DIAGNOSIS — G2581 Restless legs syndrome: Secondary | ICD-10-CM | POA: Diagnosis not present

## 2016-04-06 DIAGNOSIS — Z87891 Personal history of nicotine dependence: Secondary | ICD-10-CM | POA: Diagnosis not present

## 2016-04-06 LAB — HM COLONOSCOPY

## 2016-04-10 ENCOUNTER — Encounter: Payer: Self-pay | Admitting: Family Medicine

## 2016-04-11 DIAGNOSIS — L309 Dermatitis, unspecified: Secondary | ICD-10-CM | POA: Diagnosis not present

## 2016-04-11 DIAGNOSIS — X32XXXA Exposure to sunlight, initial encounter: Secondary | ICD-10-CM | POA: Diagnosis not present

## 2016-04-11 DIAGNOSIS — D2261 Melanocytic nevi of right upper limb, including shoulder: Secondary | ICD-10-CM | POA: Diagnosis not present

## 2016-04-11 DIAGNOSIS — L570 Actinic keratosis: Secondary | ICD-10-CM | POA: Diagnosis not present

## 2016-04-11 DIAGNOSIS — Z85828 Personal history of other malignant neoplasm of skin: Secondary | ICD-10-CM | POA: Diagnosis not present

## 2016-04-11 DIAGNOSIS — D225 Melanocytic nevi of trunk: Secondary | ICD-10-CM | POA: Diagnosis not present

## 2016-08-18 DIAGNOSIS — I10 Essential (primary) hypertension: Secondary | ICD-10-CM | POA: Diagnosis not present

## 2016-08-18 DIAGNOSIS — Z0000 Encounter for general adult medical examination without abnormal findings: Secondary | ICD-10-CM | POA: Diagnosis not present

## 2016-08-18 DIAGNOSIS — Z6827 Body mass index (BMI) 27.0-27.9, adult: Secondary | ICD-10-CM | POA: Diagnosis not present

## 2016-09-14 ENCOUNTER — Other Ambulatory Visit: Payer: Self-pay | Admitting: *Deleted

## 2016-09-14 MED ORDER — AMLODIPINE BESYLATE 10 MG PO TABS: 10.0000 mg | ORAL_TABLET | Freq: Every day | ORAL | 0 refills | Status: DC

## 2016-09-15 ENCOUNTER — Other Ambulatory Visit: Payer: Self-pay | Admitting: *Deleted

## 2016-09-15 MED ORDER — AMLODIPINE BESYLATE 10 MG PO TABS: 10.0000 mg | ORAL_TABLET | Freq: Every day | ORAL | 0 refills | Status: DC

## 2016-10-05 ENCOUNTER — Other Ambulatory Visit: Payer: Self-pay | Admitting: Family Medicine

## 2016-10-05 ENCOUNTER — Ambulatory Visit (INDEPENDENT_AMBULATORY_CARE_PROVIDER_SITE_OTHER): Payer: Medicare HMO

## 2016-10-05 VITALS — BP 140/98 | HR 83 | Temp 98.1°F | Ht 66.5 in | Wt 167.8 lb

## 2016-10-05 DIAGNOSIS — E538 Deficiency of other specified B group vitamins: Secondary | ICD-10-CM | POA: Diagnosis not present

## 2016-10-05 DIAGNOSIS — Z85038 Personal history of other malignant neoplasm of large intestine: Secondary | ICD-10-CM | POA: Diagnosis not present

## 2016-10-05 DIAGNOSIS — Z0000 Encounter for general adult medical examination without abnormal findings: Secondary | ICD-10-CM | POA: Diagnosis not present

## 2016-10-05 DIAGNOSIS — M8580 Other specified disorders of bone density and structure, unspecified site: Secondary | ICD-10-CM

## 2016-10-05 DIAGNOSIS — Z1159 Encounter for screening for other viral diseases: Secondary | ICD-10-CM | POA: Diagnosis not present

## 2016-10-05 DIAGNOSIS — I10 Essential (primary) hypertension: Secondary | ICD-10-CM

## 2016-10-05 LAB — COMPREHENSIVE METABOLIC PANEL
ALBUMIN: 4.4 g/dL (ref 3.5–5.2)
ALT: 28 U/L (ref 0–35)
AST: 19 U/L (ref 0–37)
Alkaline Phosphatase: 81 U/L (ref 39–117)
BILIRUBIN TOTAL: 0.6 mg/dL (ref 0.2–1.2)
BUN: 15 mg/dL (ref 6–23)
CO2: 25 meq/L (ref 19–32)
CREATININE: 0.99 mg/dL (ref 0.40–1.20)
Calcium: 9.4 mg/dL (ref 8.4–10.5)
Chloride: 99 mEq/L (ref 96–112)
GFR: 60.12 mL/min (ref >60.00)
Glucose, Bld: 97 mg/dL (ref 70–99)
Potassium: 4 mEq/L (ref 3.5–5.1)
Sodium: 132 mEq/L — ABNORMAL LOW (ref 135–145)
Total Protein: 6.9 g/dL (ref 6.0–8.3)

## 2016-10-05 LAB — CBC WITH DIFFERENTIAL/PLATELET
BASOS ABS: 0 10*3/uL (ref 0.0–0.1)
Basophils Relative: 0.5 % (ref 0.0–3.0)
EOS ABS: 0.1 10*3/uL (ref 0.0–0.7)
Eosinophils Relative: 1.7 % (ref 0.0–5.0)
HEMATOCRIT: 39.7 % (ref 36.0–46.0)
Hemoglobin: 13.5 g/dL (ref 12.0–15.0)
LYMPHS ABS: 1 10*3/uL (ref 0.7–4.0)
LYMPHS PCT: 17 % (ref 12.0–46.0)
MCHC: 34 g/dL (ref 30.0–36.0)
MCV: 85.5 fl (ref 78.0–100.0)
Monocytes Absolute: 0.6 10*3/uL (ref 0.1–1.0)
Monocytes Relative: 9.8 % (ref 3.0–12.0)
NEUTROS PCT: 71 % (ref 43.0–77.0)
Neutro Abs: 4.1 10*3/uL (ref 1.4–7.7)
PLATELETS: 311 10*3/uL (ref 150.0–400.0)
RBC: 4.64 Mil/uL (ref 3.87–5.11)
RDW: 13.3 % (ref 11.5–15.5)
WBC: 5.8 10*3/uL (ref 4.0–10.5)

## 2016-10-05 LAB — VITAMIN D 25 HYDROXY (VIT D DEFICIENCY, FRACTURES): VITD: 32.33 ng/mL (ref 30.00–100.00)

## 2016-10-05 LAB — VITAMIN B12: VITAMIN B 12: 165 pg/mL — AB (ref 211–911)

## 2016-10-05 NOTE — Progress Notes (Signed)
PCP notes:   Health maintenance:  Hep C screening - completed Flu vaccine - per pt, administered in Nov 2017 Shingles - postponed/insurance  Abnormal screenings:   Hearing - failed  Patient concerns:   Pt reports concerns with medications containing magnesium stearate. Pt feels this ingredient is causing rashes on body.  Nurse concerns:  P's BP was elevated: 140/98. Pt states she takes BP medications at midnight.   Next PCP appt:   10/16/16 @ 1215  I reviewed health advisor's note, was available for consultation on the day of service listed in this note, and agree with documentation and plan. Elsie Stain, MD.

## 2016-10-05 NOTE — Progress Notes (Signed)
Pre visit review using our clinic review tool, if applicable. No additional management support is needed unless otherwise documented below in the visit note. 

## 2016-10-05 NOTE — Patient Instructions (Signed)
Madeline Park , Thank you for taking time to come for your Medicare Wellness Visit. I appreciate your ongoing commitment to your health goals. Please review the following plan we discussed and let me know if I can assist you in the future.   These are the goals we discussed: Goals    . Increase physical activity          Starting 10/05/16, I will continue to exercise at least 60 min 4 days per week.       This is a list of the screening recommended for you and due dates:  Health Maintenance  Topic Date Due  . Shingles Vaccine  10/05/2017*  . Colon Cancer Screening  04/06/2017  . Mammogram  10/27/2017  . Tetanus Vaccine  08/13/2022  . Flu Shot  Addressed  .  Hepatitis C: One time screening is recommended by Center for Disease Control  (CDC) for  adults born from [birth year] through 1963.   Completed  . HIV Screening  Addressed  *Topic was postponed. The date shown is not the original due date.   Preventive Care for Adults  A healthy lifestyle and preventive care can promote health and wellness. Preventive health guidelines for adults include the following key practices.  . A routine yearly physical is a good way to check with your health care provider about your health and preventive screening. It is a chance to share any concerns and updates on your health and to receive a thorough exam.  . Visit your dentist for a routine exam and preventive care every 6 months. Brush your teeth twice a day and floss once a day. Good oral hygiene prevents tooth decay and gum disease.  . The frequency of eye exams is based on your age, health, family medical history, use  of contact lenses, and other factors. Follow your health care provider's ecommendations for frequency of eye exams.  . Eat a healthy diet. Foods like vegetables, fruits, whole grains, low-fat dairy products, and lean protein foods contain the nutrients you need without too many calories. Decrease your intake of foods high in solid fats, added  sugars, and salt. Eat the right amount of calories for you. Get information about a proper diet from your health care provider, if necessary.  . Regular physical exercise is one of the most important things you can do for your health. Most adults should get at least 150 minutes of moderate-intensity exercise (any activity that increases your heart rate and causes you to sweat) each week. In addition, most adults need muscle-strengthening exercises on 2 or more days a week.  Silver Sneakers may be a benefit available to you. To determine eligibility, you may visit the website: www.silversneakers.com or contact program at 517-056-0495 Mon-Fri between 8AM-8PM.   . Maintain a healthy weight. The body mass index (BMI) is a screening tool to identify possible weight problems. It provides an estimate of body fat based on height and weight. Your health care provider can find your BMI and can help you achieve or maintain a healthy weight.   For adults 20 years and older: ? A BMI below 18.5 is considered underweight. ? A BMI of 18.5 to 24.9 is normal. ? A BMI of 25 to 29.9 is considered overweight. ? A BMI of 30 and above is considered obese.   . Maintain normal blood lipids and cholesterol levels by exercising and minimizing your intake of saturated fat. Eat a balanced diet with plenty of fruit and vegetables. Blood tests  for lipids and cholesterol should begin at age 92 and be repeated every 5 years. If your lipid or cholesterol levels are high, you are over 50, or you are at high risk for heart disease, you may need your cholesterol levels checked more frequently. Ongoing high lipid and cholesterol levels should be treated with medicines if diet and exercise are not working.  . If you smoke, find out from your health care provider how to quit. If you do not use tobacco, please do not start.  . If you choose to drink alcohol, please do not consume more than 2 drinks per day. One drink is considered to  be 12 ounces (355 mL) of beer, 5 ounces (148 mL) of wine, or 1.5 ounces (44 mL) of liquor.  . If you are 21-49 years old, ask your health care provider if you should take aspirin to prevent strokes.  . Use sunscreen. Apply sunscreen liberally and repeatedly throughout the day. You should seek shade when your shadow is shorter than you. Protect yourself by wearing long sleeves, pants, a wide-brimmed hat, and sunglasses year round, whenever you are outdoors.  . Once a month, do a whole body skin exam, using a mirror to look at the skin on your back. Tell your health care provider of new moles, moles that have irregular borders, moles that are larger than a pencil eraser, or moles that have changed in shape or color.

## 2016-10-05 NOTE — Progress Notes (Signed)
Subjective:   Madeline Park is a 66 y.o. female who presents for Medicare Annual (Subsequent) preventive examination.  Review of Systems:  N/A Cardiac Risk Factors include: hypertension     Objective:     Vitals: BP (!) 140/98 (BP Location: Right Arm, Patient Position: Sitting, Cuff Size: Normal) Comment: no BP medications taken since 12AM  Pulse 83   Temp 98.1 F (36.7 C) (Oral)   Ht 5' 6.5" (1.689 m) Comment: no shoes  Wt 167 lb 12 oz (76.1 kg)   SpO2 98%   BMI 26.67 kg/m   Body mass index is 26.67 kg/m.   Tobacco History  Smoking Status  . Former Smoker  . Packs/day: 1.50  . Years: 30.00  . Types: Cigarettes  . Quit date: 09/04/2004  Smokeless Tobacco  . Never Used     Counseling given: No   Past Medical History:  Diagnosis Date  . B12 deficiency   . Cancer [facility])    found after colon perforation in 2003, Rectal, Stage IV; S/P surgery, chemo.  no recurrence since chemo concluded 04,  followed at [facility]  . Fistula   . Nausea    responds to reglan and zofran together  . Peritonitis ([place])    after colon perforation 2003  . [facility]    [facility]   Past Surgical History:  Procedure Laterality Date  . ABDOMINAL HYSTERECTOMY    . APPENDECTOMY    . COLOSTOMY     intially 2003, takedown 2006  . ENTEROCUTANEOUS FISTULA CLOSURE    . ILEOSTOMY     take down 2013   Family History  Problem Relation Age of Onset  . Dementia Mother   . Stroke Father   . Multiple myeloma Father   . Heart disease Father   . Colon cancer Neg Hx   . Breast cancer Neg Hx    History  Sexual Activity  . Sexual activity: No    Outpatient Encounter Prescriptions as of 10/05/2016  Medication Sig  . amLODipine (NORVASC) 10 MG tablet Take 1 tablet (10 mg total) by mouth daily. Please ask patient to make an appointment for physical exam prior to further refills.  . cholestyramine (QUESTRAN) 4 g packet TAKE 1 PACK UP TO 3 TIMES A DAY WITH A MEAL  . ondansetron (ZOFRAN) 4  MG tablet Take 1 tablet (4 mg total) by mouth every 8 (eight) hours as needed for nausea.  . Psyllium (METAMUCIL PO) Take 5 capsules by mouth daily as needed.  . Cholecalciferol 1000 units tablet Take 1-2 tablets (1,000-2,000 Units total) by mouth daily. (Patient not taking: Reported on 10/05/2016)  . cyanocobalamin 2000 MCG tablet Take 2,000 mcg by mouth every other day.  . Multiple Vitamins-Minerals (MULTIVITAMIN WITH MINERALS) tablet Take 1 tablet by mouth every other day.  . Niacinamide (NICOTINAMIDE) POWD Take daily as directed. (Patient not taking: Reported on 10/05/2016)   No facility-administered encounter medications on file as of 10/05/2016.     Activities of Daily Living In your present state of health, do you have any difficulty performing the following activities: 10/05/2016  Hearing? N  Vision? N  Difficulty concentrating or making decisions? N  Walking or climbing stairs? N  Dressing or bathing? N  Doing errands, shopping? N  Preparing Food and eating ? N  Using the Toilet? N  In the past six months, have you accidently leaked urine? N  Do you have problems with loss of bowel control? N  Managing  your Medications? N  Managing your Finances? N  Housekeeping or managing your Housekeeping? N  Some recent data might be hidden    Patient Care Team: Tonia Ghent, MD as PCP - General (Family Medicine) Kennieth Francois, MD as Consulting Physician (Dermatology)    Assessment:     Hearing Screening   '125Hz'  '250Hz'  '500Hz'  '1000Hz'  '2000Hz'  '3000Hz'  '4000Hz'  '6000Hz'  '8000Hz'   Right ear:   40 40 40  40    Left ear:   0 40 40  40    Vision Screening Comments: Future appt scheduled 10/2016   Exercise Activities and Dietary recommendations Current Exercise Habits: Home exercise routine, Type of exercise: walking;strength training/weights;Other - see comments (recumbent bike, rower, HIIT), Time (Minutes): 60, Frequency (Times/Week): 4, Weekly Exercise (Minutes/Week): 240, Intensity: Moderate,  Exercise limited by: None identified  Goals    . Increase physical activity          Starting 10/05/16, I will continue to exercise at least 60 min 4 days per week.      Fall Risk Fall Risk  10/05/2016 09/30/2015 08/04/2014  Falls in the past year? No No No   Depression Screen PHQ 2/9 Scores 10/05/2016 09/30/2015 08/04/2014  PHQ - 2 Score 0 0 1     Cognitive Function MMSE - Mini Mental State Exam 10/05/2016  Orientation to time 5  Orientation to Place 5  Registration 3  Attention/ Calculation 0  Recall 3  Language- name 2 objects 0  Language- repeat 1  Language- follow 3 step command 3  Language- read & follow direction 0  Write a sentence 0  Copy design 0  Total score 20       PLEASE NOTE: A Mini-Cog screen was completed. Maximum score is 20. A value of 0 denotes this part of Folstein MMSE was not completed or the patient failed this part of the Mini-Cog screening.   Mini-Cog Screening Orientation to Time - Max 5 pts Orientation to Place - Max 5 pts Registration - Max 3 pts Recall - Max 3 pts Language Repeat - Max 1 pts Language Follow 3 Step Command - Max 3 pts   Immunization History  Administered Date(s) Administered  . Influenza-Unspecified 07/05/2016  . Tdap 08/13/2012   Screening Tests Health Maintenance  Topic Date Due  . ZOSTAVAX  10/05/2017 (Originally 04/13/2013)  . COLONOSCOPY  04/06/2017  . MAMMOGRAM  10/27/2017  . TETANUS/TDAP  08/13/2022  . INFLUENZA VACCINE  Addressed  . Hepatitis C Screening  Completed  . HIV Screening  Addressed      Plan:     I have personally reviewed and addressed the Medicare Annual Wellness questionnaire and have noted the following in the patient's chart:  A. Medical and social history B. Use of alcohol, tobacco or illicit drugs  C. Current medications and supplements D. Functional ability and status E.  Nutritional status F.  Physical activity G. Advance directives H. List of other physicians I.  Hospitalizations,  surgeries, and ER visits in previous 12 months J.  [place] to include hearing, vision, cognitive, depression L. Referrals and appointments - none  In addition, I have reviewed and discussed with patient certain preventive protocols, quality metrics, and best practice recommendations. A written personalized care plan for preventive services as well as general preventive health recommendations were provided to patient.  See attached scanned questionnaire for additional information.   Signed,   Lindell Noe, MHA, BS, LPN Health Coach

## 2016-10-06 LAB — HEPATITIS C ANTIBODY: HCV Ab: NEGATIVE

## 2016-10-06 LAB — CEA: CEA: 2.9 ng/mL — ABNORMAL HIGH

## 2016-10-11 DIAGNOSIS — D2261 Melanocytic nevi of right upper limb, including shoulder: Secondary | ICD-10-CM | POA: Diagnosis not present

## 2016-10-11 DIAGNOSIS — X32XXXA Exposure to sunlight, initial encounter: Secondary | ICD-10-CM | POA: Diagnosis not present

## 2016-10-11 DIAGNOSIS — D2272 Melanocytic nevi of left lower limb, including hip: Secondary | ICD-10-CM | POA: Diagnosis not present

## 2016-10-11 DIAGNOSIS — Z85828 Personal history of other malignant neoplasm of skin: Secondary | ICD-10-CM | POA: Diagnosis not present

## 2016-10-11 DIAGNOSIS — D225 Melanocytic nevi of trunk: Secondary | ICD-10-CM | POA: Diagnosis not present

## 2016-10-11 DIAGNOSIS — L570 Actinic keratosis: Secondary | ICD-10-CM | POA: Diagnosis not present

## 2016-10-12 ENCOUNTER — Other Ambulatory Visit: Payer: Self-pay | Admitting: Family Medicine

## 2016-10-13 DIAGNOSIS — H524 Presbyopia: Secondary | ICD-10-CM | POA: Diagnosis not present

## 2016-10-13 MED ORDER — AMLODIPINE BESYLATE 10 MG PO TABS: 10.0000 mg | ORAL_TABLET | Freq: Every day | ORAL | 0 refills | Status: DC

## 2016-10-16 ENCOUNTER — Ambulatory Visit (INDEPENDENT_AMBULATORY_CARE_PROVIDER_SITE_OTHER): Payer: Medicare HMO | Admitting: Family Medicine

## 2016-10-16 ENCOUNTER — Encounter: Payer: Self-pay | Admitting: Family Medicine

## 2016-10-16 VITALS — BP 140/86 | HR 70 | Temp 98.4°F | Wt 168.8 lb

## 2016-10-16 DIAGNOSIS — K912 Postsurgical malabsorption, not elsewhere classified: Secondary | ICD-10-CM

## 2016-10-16 DIAGNOSIS — E538 Deficiency of other specified B group vitamins: Secondary | ICD-10-CM | POA: Diagnosis not present

## 2016-10-16 DIAGNOSIS — Z0000 Encounter for general adult medical examination without abnormal findings: Secondary | ICD-10-CM

## 2016-10-16 DIAGNOSIS — I10 Essential (primary) hypertension: Secondary | ICD-10-CM

## 2016-10-16 DIAGNOSIS — Z85038 Personal history of other malignant neoplasm of large intestine: Secondary | ICD-10-CM | POA: Diagnosis not present

## 2016-10-16 MED ORDER — ONDANSETRON HCL 4 MG PO TABS: 4.0000 mg | ORAL_TABLET | Freq: Three times a day (TID) | ORAL | 5 refills | Status: DC | PRN

## 2016-10-16 MED ORDER — CHOLESTYRAMINE 4 G PO PACK: PACK | ORAL | 3 refills | Status: DC

## 2016-10-16 MED ORDER — CYANOCOBALAMIN 1000 MCG/ML IJ SOLN
1000.0000 ug | Freq: Once | INTRAMUSCULAR | Status: AC
  Administered 2016-10-16: 1000 ug via INTRAMUSCULAR

## 2016-10-16 MED ORDER — CYANOCOBALAMIN 1000 MCG/ML IJ SOLN: 1000.0000 ug | INTRAMUSCULAR | 1 refills | Status: DC

## 2016-10-16 MED ORDER — "NEEDLE (DISP) 25G X 1-1/2"" MISC": 1 refills | Status: DC

## 2016-10-16 MED ORDER — METOPROLOL SUCCINATE ER 50 MG PO TB24: 50.0000 mg | ORAL_TABLET | Freq: Every day | ORAL | 3 refills | Status: DC

## 2016-10-16 NOTE — Patient Instructions (Addendum)
Stop amlodipine.  Change to metoprolol.  Update me as needed, if your BP is high/low or if you pulse is <<60.  Monthly B12 shot, either here (or eventually at home).  I would schedule the next one here in 1 month.  Recheck B12 in about 6 months.    Take care.  Glad to see you.

## 2016-10-16 NOTE — Progress Notes (Signed)
H/o colon cancer.  CEA at baseline, d/w pt, not elevated.    Hypertension:    Using medication without problems or lightheadedness: yes Chest pain with exertion:no Edema:no Short of breath:no We talked about changing amlodipine to limit the inert ingredient exposure.  See below.    She had seen derm clinic re: rash.  She cut back on nicotinamide but that didn't help.  Still with a small rash on the scalp.  Treated with steroid cream, possibly TAC 0.1% cream per derm.  It helps the itching some but it doesn't fully go away.  She thought an inert component in her pills is causing the rash, and she has tried to find meds w/o the same inert ingredients.  She had the rash with oral B12, likely not from B12 itself.  She was asking about going back on injection B12.  The rash clearly got better with stopping imodium prev.  She has had allergy w/u prev, w/o other cause seen.  She was concerned that amlodipine had the same inert ingredients.    GI sx are manageable.  She is usually eating 1 meal a day with variable GI output, some days being better than others. She is careful with her diet.    Hep C screening - completed Flu vaccine - per pt, administered in Nov 2017 Shingles - postponed/insurance, dw pt.   Mammogram to be done every other year, d/w pt.   Abnormal screenings: Hearing - failed.  Declined hearing aids.   Advance directive- oldest sister Madeline Park designated if patient were incapacitated.   PMH and SH reviewed  ROS: Per HPI unless specifically indicated in ROS section   Meds, vitals, and allergies reviewed.   GEN: nad, alert and oriented HEENT: mucous membranes moist NECK: supple w/o LA CV: rrr.  no murmur PULM: ctab, no inc wob ABD: soft, +bs EXT: no edema SKIN: no acute rash, but small area of chronic skin irritation on the right posterior scalp noted near the occiput

## 2016-10-16 NOTE — Progress Notes (Signed)
Pre visit review using our clinic review tool, if applicable. No additional management support is needed unless otherwise documented below in the visit note. 

## 2016-10-18 DIAGNOSIS — Z0000 Encounter for general adult medical examination without abnormal findings: Secondary | ICD-10-CM | POA: Insufficient documentation

## 2016-10-18 NOTE — Assessment & Plan Note (Signed)
CEA not elevated. No new symptoms. Can check yearly. Discussed with patient. She agrees.

## 2016-10-18 NOTE — Assessment & Plan Note (Signed)
Change her back to injection B12. She was worried that she was getting a rash from an inert ingredients in the B12 shots.

## 2016-10-18 NOTE — Assessment & Plan Note (Signed)
Manageable symptoms. No blood in stool. She has a chronically low sodium noted. Discussed with patient.

## 2016-10-18 NOTE — Assessment & Plan Note (Signed)
Hep C screening - completed Flu vaccine - per pt, administered in Nov 2017 Shingles - postponed/insurance, dw pt.   Mammogram to be done every other year, d/w pt.   Abnormal screenings: Hearing - failed.  Declined hearing aids.   Advance directive- oldest sister Milinda Hirschfeld designated if patient were incapacitated.

## 2016-10-18 NOTE — Assessment & Plan Note (Addendum)
She was worried that an inert ingredient in amlodipine was causing the rash. Stop amlodipine. Change to metoprolol. Routine cautions given. See after visit summary. She will update me about her blood pressure and pulse if needed, update me about the rash as needed. Okay for outpatient follow-up. >25 minutes spent in face to face time with patient, >50% spent in counselling or coordination of care.

## 2016-10-19 DIAGNOSIS — R69 Illness, unspecified: Secondary | ICD-10-CM | POA: Diagnosis not present

## 2016-11-16 ENCOUNTER — Ambulatory Visit (INDEPENDENT_AMBULATORY_CARE_PROVIDER_SITE_OTHER): Payer: Medicare HMO

## 2016-11-16 DIAGNOSIS — E538 Deficiency of other specified B group vitamins: Secondary | ICD-10-CM | POA: Diagnosis not present

## 2016-11-16 MED ORDER — CYANOCOBALAMIN 1000 MCG/ML IJ SOLN
1000.0000 ug | Freq: Once | INTRAMUSCULAR | Status: AC
  Administered 2016-11-16: 1000 ug via INTRAMUSCULAR

## 2016-12-09 ENCOUNTER — Other Ambulatory Visit: Payer: Self-pay | Admitting: Family Medicine

## 2016-12-09 NOTE — Telephone Encounter (Signed)
Last office visit 10/16/2016. Amlodipine is not on current medication list.  Refill?

## 2016-12-10 NOTE — Telephone Encounter (Signed)
Prev changed to metoprolol.  Denied.  Thanks.

## 2016-12-14 ENCOUNTER — Encounter: Payer: Self-pay | Admitting: Family Medicine

## 2016-12-19 ENCOUNTER — Other Ambulatory Visit: Payer: Self-pay | Admitting: Family Medicine

## 2016-12-19 MED ORDER — CARVEDILOL 3.125 MG PO TABS: 3.1250 mg | ORAL_TABLET | Freq: Two times a day (BID) | ORAL | 1 refills | Status: DC

## 2016-12-21 ENCOUNTER — Other Ambulatory Visit: Payer: Self-pay | Admitting: Family Medicine

## 2016-12-21 MED ORDER — CARVEDILOL 3.125 MG PO TABS: 3.1250 mg | ORAL_TABLET | Freq: Two times a day (BID) | ORAL | 1 refills | Status: DC

## 2016-12-21 NOTE — Progress Notes (Signed)
I called pt.  [facility].  D/w pt.  She'll update me re: her BP.   Please cancel the carvedilol prescription at [facility]. Thanks.  Rx canceled at [facility].  Mike Craze, CMA  12/22/16

## 2017-02-07 ENCOUNTER — Encounter: Payer: Self-pay | Admitting: Family Medicine

## 2017-02-08 ENCOUNTER — Other Ambulatory Visit: Payer: Self-pay | Admitting: Family Medicine

## 2017-02-08 MED ORDER — CHOLESTYRAMINE 4 G PO PACK: PACK | ORAL | 3 refills | Status: DC

## 2017-02-08 MED ORDER — ONDANSETRON HCL 4 MG PO TABS: 4.0000 mg | ORAL_TABLET | Freq: Three times a day (TID) | ORAL | 12 refills | Status: DC | PRN

## 2017-02-08 MED ORDER — METOPROLOL SUCCINATE ER 50 MG PO TB24: 50.0000 mg | ORAL_TABLET | Freq: Every day | ORAL | 3 refills | Status: DC

## 2017-03-02 ENCOUNTER — Encounter: Payer: Self-pay | Admitting: Family Medicine

## 2017-03-06 ENCOUNTER — Other Ambulatory Visit: Payer: Self-pay | Admitting: Family Medicine

## 2017-03-06 MED ORDER — CLONIDINE HCL 0.1 MG PO TABS: 0.1000 mg | ORAL_TABLET | Freq: Two times a day (BID) | ORAL | 3 refills | Status: DC

## 2017-03-15 ENCOUNTER — Encounter: Payer: Self-pay | Admitting: Family Medicine

## 2017-03-19 ENCOUNTER — Telehealth: Payer: Self-pay

## 2017-03-19 ENCOUNTER — Other Ambulatory Visit: Payer: Self-pay | Admitting: Family Medicine

## 2017-03-19 MED ORDER — DIPHENOXYLATE-ATROPINE 2.5-0.025 MG/5ML PO LIQD: 5.0000 mL | Freq: Four times a day (QID) | ORAL | 1 refills | Status: DC | PRN

## 2017-03-19 NOTE — Telephone Encounter (Signed)
Pt request lomotil called to Vicente Males at [facility]; done. I spoke with Bea at [facility] and cancelled lomotil rx. Pt requested [facility] [place] as only rx on profile; done. Pt appreciative and will [facility] later today to pick [facility] med.

## 2017-03-19 NOTE — Progress Notes (Signed)
Please call in lomotil.  See mychart message.  Thanks.   Medication phoned to pharmacy.

## 2017-07-02 ENCOUNTER — Other Ambulatory Visit: Payer: Self-pay | Admitting: Family Medicine

## 2017-07-25 DIAGNOSIS — Z980 Intestinal bypass and anastomosis status: Secondary | ICD-10-CM | POA: Diagnosis not present

## 2017-07-25 DIAGNOSIS — Z1211 Encounter for screening for malignant neoplasm of colon: Secondary | ICD-10-CM | POA: Diagnosis not present

## 2017-07-25 DIAGNOSIS — Z85038 Personal history of other malignant neoplasm of large intestine: Secondary | ICD-10-CM | POA: Diagnosis not present

## 2017-07-25 DIAGNOSIS — I10 Essential (primary) hypertension: Secondary | ICD-10-CM | POA: Diagnosis not present

## 2017-07-25 LAB — HM COLONOSCOPY

## 2017-07-30 ENCOUNTER — Encounter: Payer: Self-pay | Admitting: Family Medicine

## 2017-08-24 DIAGNOSIS — Z09 Encounter for follow-up examination after completed treatment for conditions other than malignant neoplasm: Secondary | ICD-10-CM | POA: Diagnosis not present

## 2017-08-24 DIAGNOSIS — Z08 Encounter for follow-up examination after completed treatment for malignant neoplasm: Secondary | ICD-10-CM | POA: Diagnosis not present

## 2017-08-24 DIAGNOSIS — Z85828 Personal history of other malignant neoplasm of skin: Secondary | ICD-10-CM | POA: Diagnosis not present

## 2017-08-24 DIAGNOSIS — D485 Neoplasm of uncertain behavior of skin: Secondary | ICD-10-CM | POA: Diagnosis not present

## 2017-08-24 DIAGNOSIS — L2084 Intrinsic (allergic) eczema: Secondary | ICD-10-CM | POA: Diagnosis not present

## 2017-08-24 DIAGNOSIS — X32XXXA Exposure to sunlight, initial encounter: Secondary | ICD-10-CM | POA: Diagnosis not present

## 2017-08-24 DIAGNOSIS — D0439 Carcinoma in situ of skin of other parts of face: Secondary | ICD-10-CM | POA: Diagnosis not present

## 2017-08-24 DIAGNOSIS — L570 Actinic keratosis: Secondary | ICD-10-CM | POA: Diagnosis not present

## 2017-10-29 ENCOUNTER — Telehealth: Payer: Self-pay | Admitting: *Deleted

## 2017-10-29 NOTE — Telephone Encounter (Signed)
Fax received from [facility] [place] concerning Cholestyramine backordered, unavailable in the packet but can be dispensed in the jar.  New Rx for Jar please.

## 2017-10-30 MED ORDER — CHOLESTYRAMINE POWD: 4.0000 g | Freq: Three times a day (TID) | 12 refills | Status: DC | PRN

## 2017-10-30 NOTE — Telephone Encounter (Signed)
Sent. Thanks.   

## 2017-10-30 NOTE — Addendum Note (Signed)
Addended by: Tonia Ghent on: 10/30/2017 06:40 AM   Modules accepted: Orders

## 2017-11-02 ENCOUNTER — Other Ambulatory Visit: Payer: Self-pay | Admitting: Family Medicine

## 2017-11-28 ENCOUNTER — Other Ambulatory Visit: Payer: Self-pay | Admitting: Family Medicine

## 2017-11-29 ENCOUNTER — Encounter: Payer: Self-pay | Admitting: *Deleted

## 2017-12-23 ENCOUNTER — Other Ambulatory Visit: Payer: Self-pay | Admitting: Family Medicine

## 2017-12-23 DIAGNOSIS — Z85038 Personal history of other malignant neoplasm of large intestine: Secondary | ICD-10-CM

## 2017-12-23 DIAGNOSIS — E538 Deficiency of other specified B group vitamins: Secondary | ICD-10-CM

## 2017-12-26 ENCOUNTER — Other Ambulatory Visit: Payer: Self-pay | Admitting: Family Medicine

## 2017-12-26 ENCOUNTER — Other Ambulatory Visit (INDEPENDENT_AMBULATORY_CARE_PROVIDER_SITE_OTHER): Payer: Medicare HMO

## 2017-12-26 DIAGNOSIS — E538 Deficiency of other specified B group vitamins: Secondary | ICD-10-CM

## 2017-12-26 DIAGNOSIS — Z85038 Personal history of other malignant neoplasm of large intestine: Secondary | ICD-10-CM

## 2017-12-26 LAB — COMPREHENSIVE METABOLIC PANEL
ALBUMIN: 4.3 g/dL (ref 3.5–5.2)
ALT: 14 U/L (ref 0–35)
AST: 19 U/L (ref 0–37)
Alkaline Phosphatase: 70 U/L (ref 39–117)
BILIRUBIN TOTAL: 1 mg/dL (ref 0.2–1.2)
BUN: 16 mg/dL (ref 6–23)
CALCIUM: 9.6 mg/dL (ref 8.4–10.5)
CO2: 24 mEq/L (ref 19–32)
CREATININE: 0.97 mg/dL (ref 0.40–1.20)
Chloride: 93 mEq/L — ABNORMAL LOW (ref 96–112)
GFR: 61.31 mL/min (ref >60.00)
Glucose, Bld: 99 mg/dL (ref 70–99)
Potassium: 5.3 mEq/L — ABNORMAL HIGH (ref 3.5–5.1)
Sodium: 125 mEq/L — ABNORMAL LOW (ref 135–145)
Total Protein: 7.1 g/dL (ref 6.0–8.3)

## 2017-12-26 LAB — CBC WITH DIFFERENTIAL/PLATELET
BASOS ABS: 0 10*3/uL (ref 0.0–0.1)
BASOS PCT: 0.5 % (ref 0.0–3.0)
EOS ABS: 0.8 10*3/uL — AB (ref 0.0–0.7)
Eosinophils Relative: 13.8 % — ABNORMAL HIGH (ref 0.0–5.0)
HEMATOCRIT: 42.5 % (ref 36.0–46.0)
HEMOGLOBIN: 14.3 g/dL (ref 12.0–15.0)
Lymphocytes Relative: 13.5 % (ref 12.0–46.0)
Lymphs Abs: 0.8 10*3/uL (ref 0.7–4.0)
MCHC: 33.7 g/dL (ref 30.0–36.0)
MCV: 85.9 fl (ref 78.0–100.0)
Monocytes Absolute: 0.6 10*3/uL (ref 0.1–1.0)
Monocytes Relative: 9.7 % (ref 3.0–12.0)
Neutro Abs: 3.7 10*3/uL (ref 1.4–7.7)
Neutrophils Relative %: 62.5 % (ref 43.0–77.0)
Platelets: 292 10*3/uL (ref 150.0–400.0)
RBC: 4.95 Mil/uL (ref 3.87–5.11)
RDW: 12.8 % (ref 11.5–15.5)
WBC: 5.9 10*3/uL (ref 4.0–10.5)

## 2017-12-26 LAB — VITAMIN B12: VITAMIN B 12: 144 pg/mL — AB (ref 211–911)

## 2017-12-26 LAB — VITAMIN D 25 HYDROXY (VIT D DEFICIENCY, FRACTURES): VITD: 32.53 ng/mL (ref 30.00–100.00)

## 2017-12-28 LAB — CEA: CEA: 2.9 ng/mL — ABNORMAL HIGH

## 2018-01-03 ENCOUNTER — Ambulatory Visit (INDEPENDENT_AMBULATORY_CARE_PROVIDER_SITE_OTHER): Payer: Medicare HMO | Admitting: Family Medicine

## 2018-01-03 ENCOUNTER — Encounter: Payer: Self-pay | Admitting: Family Medicine

## 2018-01-03 VITALS — BP 182/122 | HR 82 | Temp 98.3°F | Ht 67.0 in | Wt 166.0 lb

## 2018-01-03 DIAGNOSIS — E871 Hypo-osmolality and hyponatremia: Secondary | ICD-10-CM | POA: Diagnosis not present

## 2018-01-03 DIAGNOSIS — Z85038 Personal history of other malignant neoplasm of large intestine: Secondary | ICD-10-CM

## 2018-01-03 DIAGNOSIS — E538 Deficiency of other specified B group vitamins: Secondary | ICD-10-CM | POA: Diagnosis not present

## 2018-01-03 DIAGNOSIS — Z0000 Encounter for general adult medical examination without abnormal findings: Secondary | ICD-10-CM | POA: Diagnosis not present

## 2018-01-03 DIAGNOSIS — I10 Essential (primary) hypertension: Secondary | ICD-10-CM

## 2018-01-03 MED ORDER — CYANOCOBALAMIN 1000 MCG/ML IJ SOLN
1000.0000 ug | Freq: Once | INTRAMUSCULAR | Status: AC
  Administered 2018-01-03: 1000 ug via INTRAMUSCULAR

## 2018-01-03 MED ORDER — CYANOCOBALAMIN 1000 MCG/ML IJ SOLN: 1000.0000 ug | INTRAMUSCULAR | 1 refills | Status: DC

## 2018-01-03 NOTE — Patient Instructions (Addendum)
Recheck labs in about 1-2 week, nonfasting lab visit.   B12 shot today.   Repeat injection in about 1 month, with training for B12 shot.  Recheck B12 level in about 6 months.   Update me as needed.   Take care.  Glad to see you.   You can call for a mammogram at [facility] [address] [place]

## 2018-01-03 NOTE — Progress Notes (Signed)
I have personally reviewed the Medicare Annual Wellness questionnaire and have noted 1. The patient's medical and social history 2. Their use of alcohol, tobacco or illicit drugs 3. Their current medications and supplements 4. The patient's functional ability including ADL's, fall risks, home safety risks and hearing or visual             impairment. 5. Diet and physical activities 6. Evidence for depression or mood disorders  The patients weight, height, BMI have been recorded in the chart and visual acuity is per eye clinic.  I have made referrals, counseling and provided education to the patient based review of the above and I have provided the pt with a written personalized care plan for preventive services.  Provider list updated- see scanned forms.  Routine anticipatory guidance given to patient.  See health maintenance. The possibility exists that previously documented standard health maintenance information may have been brought forward from a previous encounter into this note.  If needed, that same information has been updated to reflect the current situation based on today's encounter.    Flu encouraged.  Shingles out of stock.  PNA due at 67 Tetanus 2013 Colonoscopy 2018  Breast cancer screening due, d/w pt.   DXA 2017 Advance directive- oldest sister Milinda Hirschfeld designated if patient were incapacitated. Cognitive function addressed- see scanned forms- and if abnormal then additional documentation follows.   Hypertension:    Using medication without problems or lightheadedness: yes Chest pain with exertion:no Edema:no Short of breath:no Average home BPs: usually ~120/70s typically at home.  Recheck 160/100.  She'll check BP at home and update me as needed.  She likely has significant whitecoat hypertension.  Discussed.  History of colon cancer.  CEA unchanged, d/w pt.  She is tolerating 1 meal a day.  Mildly low Na with inc in water intake, she is going to cut back some on  free water intake.  She uses zofran prn.  We talked about rechecking be met in the near future about her sodium potassium and creatinine.  She agrees.  Orders placed.  B12 d/w pt. She had been off injections.  D/w pt. injection done today.  Repeat in 1 month with training at that point so she can hopefully start doing B12 injections monthly at home.  Discussed with patient.   Her sister died since last OV, d/w pt, condolences offered.    PMH and SH reviewed  Meds, vitals, and allergies reviewed.   ROS: Per HPI.  Unless specifically indicated otherwise in HPI, the patient denies:  General: fever. Eyes: acute vision changes ENT: sore throat Cardiovascular: chest pain Respiratory: SOB GI: vomiting GU: dysuria Musculoskeletal: acute back pain Derm: acute rash Neuro: acute motor dysfunction Psych: worsening mood Endocrine: polydipsia Heme: bleeding Allergy: hayfever  GEN: nad, alert and oriented HEENT: mucous membranes moist NECK: supple w/o LA CV: rrr. PULM: ctab, no inc wob ABD: soft, +bs EXT: no edema SKIN: no acute rash

## 2018-01-04 DIAGNOSIS — R110 Nausea: Secondary | ICD-10-CM | POA: Diagnosis not present

## 2018-01-04 DIAGNOSIS — I10 Essential (primary) hypertension: Secondary | ICD-10-CM | POA: Diagnosis not present

## 2018-01-04 DIAGNOSIS — Z888 Allergy status to other drugs, medicaments and biological substances status: Secondary | ICD-10-CM | POA: Diagnosis not present

## 2018-01-04 DIAGNOSIS — Z809 Family history of malignant neoplasm, unspecified: Secondary | ICD-10-CM | POA: Diagnosis not present

## 2018-01-04 DIAGNOSIS — Z85038 Personal history of other malignant neoplasm of large intestine: Secondary | ICD-10-CM | POA: Diagnosis not present

## 2018-01-04 DIAGNOSIS — R197 Diarrhea, unspecified: Secondary | ICD-10-CM | POA: Diagnosis not present

## 2018-01-04 DIAGNOSIS — E538 Deficiency of other specified B group vitamins: Secondary | ICD-10-CM | POA: Diagnosis not present

## 2018-01-04 DIAGNOSIS — Z91041 Radiographic dye allergy status: Secondary | ICD-10-CM | POA: Diagnosis not present

## 2018-01-04 MED ORDER — ONDANSETRON HCL 4 MG PO TABS: 4.0000 mg | ORAL_TABLET | Freq: Three times a day (TID) | ORAL | 12 refills | Status: DC | PRN

## 2018-01-04 NOTE — Assessment & Plan Note (Signed)
injection done today.  Repeat in 1 month with training at that point so she can hopefully start doing B12 injections monthly at home.  Discussed with patient.

## 2018-01-04 NOTE — Assessment & Plan Note (Signed)
Flu encouraged.  Shingles out of stock.  PNA due at 67 Tetanus 2013 Colonoscopy 2018  Breast cancer screening due, d/w pt.   DXA 2017 Advance directive- oldest sister Milinda Hirschfeld designated if patient were incapacitated. Cognitive function addressed- see scanned forms- and if abnormal then additional documentation follows.

## 2018-01-04 NOTE — Assessment & Plan Note (Signed)
She has significant whitecoat hypertension.  She has been able to tolerate clonidine with normal blood pressures on home checks otherwise.  This was the only medicine that she was really able to tolerate for her blood pressure.  Would continue as is.  She will check at home and update me if persistently elevated.  Okay for outpatient follow-up.

## 2018-01-04 NOTE — Assessment & Plan Note (Signed)
Up-to-date on colonoscopy.  CEA is stable.  No new symptoms.  She has history of short gut syndrome and is able to tolerate about 1 meal per day.  This is her baseline.  It is frustrating for her but she is able to tolerate it as is.  Her weight is stable.  I would use Zofran as needed.  She agrees.  Okay for outpatient follow-up.

## 2018-01-17 ENCOUNTER — Other Ambulatory Visit (INDEPENDENT_AMBULATORY_CARE_PROVIDER_SITE_OTHER): Payer: Medicare HMO

## 2018-01-17 DIAGNOSIS — E871 Hypo-osmolality and hyponatremia: Secondary | ICD-10-CM | POA: Diagnosis not present

## 2018-01-17 LAB — BASIC METABOLIC PANEL
BUN: 13 mg/dL (ref 6–23)
CHLORIDE: 95 meq/L — AB (ref 96–112)
CO2: 26 mEq/L (ref 19–32)
Calcium: 9.6 mg/dL (ref 8.4–10.5)
Creatinine, Ser: 1.05 mg/dL (ref 0.40–1.20)
GFR: 55.95 mL/min — ABNORMAL LOW (ref >60.00)
GLUCOSE: 114 mg/dL — AB (ref 70–99)
POTASSIUM: 4.3 meq/L (ref 3.5–5.1)
Sodium: 131 mEq/L — ABNORMAL LOW (ref 135–145)

## 2018-01-21 ENCOUNTER — Encounter: Payer: Self-pay | Admitting: Family Medicine

## 2018-01-22 ENCOUNTER — Other Ambulatory Visit: Payer: Self-pay | Admitting: Family Medicine

## 2018-01-22 ENCOUNTER — Encounter: Payer: Self-pay | Admitting: *Deleted

## 2018-01-22 DIAGNOSIS — D2261 Melanocytic nevi of right upper limb, including shoulder: Secondary | ICD-10-CM | POA: Diagnosis not present

## 2018-01-22 DIAGNOSIS — Z09 Encounter for follow-up examination after completed treatment for conditions other than malignant neoplasm: Secondary | ICD-10-CM | POA: Diagnosis not present

## 2018-01-22 DIAGNOSIS — Z872 Personal history of diseases of the skin and subcutaneous tissue: Secondary | ICD-10-CM | POA: Diagnosis not present

## 2018-01-22 DIAGNOSIS — D2262 Melanocytic nevi of left upper limb, including shoulder: Secondary | ICD-10-CM | POA: Diagnosis not present

## 2018-01-22 DIAGNOSIS — L570 Actinic keratosis: Secondary | ICD-10-CM | POA: Diagnosis not present

## 2018-01-22 DIAGNOSIS — D2272 Melanocytic nevi of left lower limb, including hip: Secondary | ICD-10-CM | POA: Diagnosis not present

## 2018-01-22 DIAGNOSIS — Z85828 Personal history of other malignant neoplasm of skin: Secondary | ICD-10-CM | POA: Diagnosis not present

## 2018-01-22 DIAGNOSIS — D225 Melanocytic nevi of trunk: Secondary | ICD-10-CM | POA: Diagnosis not present

## 2018-01-22 DIAGNOSIS — D2271 Melanocytic nevi of right lower limb, including hip: Secondary | ICD-10-CM | POA: Diagnosis not present

## 2018-01-22 DIAGNOSIS — Z08 Encounter for follow-up examination after completed treatment for malignant neoplasm: Secondary | ICD-10-CM | POA: Diagnosis not present

## 2018-01-22 MED ORDER — CLONIDINE HCL 0.1 MG PO TABS: 0.1000 mg | ORAL_TABLET | Freq: Two times a day (BID) | ORAL | Status: DC

## 2018-01-25 ENCOUNTER — Ambulatory Visit: Payer: Self-pay | Admitting: *Deleted

## 2018-01-25 ENCOUNTER — Encounter: Payer: Self-pay | Admitting: Emergency Medicine

## 2018-01-25 ENCOUNTER — Telehealth: Payer: Self-pay

## 2018-01-25 ENCOUNTER — Other Ambulatory Visit: Payer: Self-pay

## 2018-01-25 ENCOUNTER — Emergency Department
Admission: EM | Admit: 2018-01-25 | Discharge: 2018-01-25 | Disposition: A | Discharge status: Home / Self Care | Payer: Medicare HMO | Attending: Emergency Medicine | Admitting: Emergency Medicine

## 2018-01-25 DIAGNOSIS — Z85828 Personal history of other malignant neoplasm of skin: Secondary | ICD-10-CM | POA: Insufficient documentation

## 2018-01-25 DIAGNOSIS — Z79899 Other long term (current) drug therapy: Secondary | ICD-10-CM | POA: Diagnosis not present

## 2018-01-25 DIAGNOSIS — Z85048 Personal history of other malignant neoplasm of rectum, rectosigmoid junction, and anus: Secondary | ICD-10-CM | POA: Insufficient documentation

## 2018-01-25 DIAGNOSIS — Z87891 Personal history of nicotine dependence: Secondary | ICD-10-CM | POA: Diagnosis not present

## 2018-01-25 DIAGNOSIS — I10 Essential (primary) hypertension: Secondary | ICD-10-CM | POA: Diagnosis not present

## 2018-01-25 LAB — BASIC METABOLIC PANEL
ANION GAP: 9 (ref 5–15)
BUN: 17 mg/dL (ref 6–20)
CHLORIDE: 98 mmol/L — AB (ref 101–111)
CO2: 25 mmol/L (ref 22–32)
Calcium: 9.6 mg/dL (ref 8.9–10.3)
Creatinine, Ser: 0.98 mg/dL (ref 0.44–1.00)
GFR calc Af Amer: >60 mL/min (ref >60)
GFR calc non Af Amer: 60 mL/min — ABNORMAL LOW (ref >60)
Glucose, Bld: 93 mg/dL (ref 65–99)
POTASSIUM: 3.7 mmol/L (ref 3.5–5.1)
Sodium: 132 mmol/L — ABNORMAL LOW (ref 135–145)

## 2018-01-25 LAB — CBC
HEMATOCRIT: 41 % (ref 35.0–47.0)
HEMOGLOBIN: 14 g/dL (ref 12.0–16.0)
MCH: 29 pg (ref 26.0–34.0)
MCHC: 34.2 g/dL (ref 32.0–36.0)
MCV: 84.9 fL (ref 80.0–100.0)
Platelets: 269 10*3/uL (ref 150–440)
RBC: 4.83 MIL/uL (ref 3.80–5.20)
RDW: 13.9 % (ref 11.5–14.5)
WBC: 5.4 10*3/uL (ref 3.6–11.0)

## 2018-01-25 MED ORDER — CLONIDINE HCL 0.1 MG PO TABS
0.1000 mg | ORAL_TABLET | ORAL | Status: AC
  Administered 2018-01-25: 0.1 mg via ORAL
  Filled 2018-01-25: qty 1

## 2018-01-25 MED ORDER — CLONIDINE HCL 0.1 MG PO TABS: 0.2000 mg | ORAL_TABLET | Freq: Three times a day (TID) | ORAL | 0 refills | Status: DC

## 2018-01-25 NOTE — Telephone Encounter (Signed)
Nicole Kindred with [facility] called; pt taking Clonidine 0.1 mg taking 2 tabs bid. No symptoms; earlier BP 180/108 and now BP 227/125. Dr Damita Dunnings said pt needs to go to ED. Nicole Kindred voiced understanding and will relay to pt. FYI to Dr Damita Dunnings.

## 2018-01-25 NOTE — ED Provider Notes (Signed)
[facility] Emergency Department Provider Note   ____________________________________________   First MD Initiated Contact with Patient 01/25/18 1609     (approximate)  I have reviewed the triage vital signs and the nursing notes.   HISTORY  Chief Complaint Hypertension    HPI Madeline Park is a 67 y.o. female resents for evaluation for elevated blood pressure.  Patient reports that she saw her primary care doctor about 2 weeks ago, they have been adjusting her blood pressure medicine.  She is currently on clonidine 0.2 mg twice daily which she last took at 10 AM today.  She is tied about 6 or 7 previous blood pressure medicines which she has not been able to tolerate, and they finally settled on clonidine.  She does not have any symptoms at all, she checked her blood pressure at home today and this morning it was elevated to over 180, yesterday at about the same time it was in the 161W systolic.  She had no headache no chest pain.  Reports she exercised well yesterday without any difficulty.  No numbness tingling weakness.  No trouble urinating.  Reports she has no symptoms she has very difficult to control blood pressure she reports she really cannot take any other medication since she does not tolerate them the atenolol because her stomach upset.    Past Medical History:  Diagnosis Date  . B12 deficiency   . Cancer [facility])    found after colon perforation in 2003, Rectal, Stage IV; S/P surgery, chemo.  no recurrence since chemo concluded 04,  followed at [facility]  . Fistula   . Nausea    responds to reglan and zofran together  . Peritonitis ([place])    after colon perforation 2003  . Skin cancer    per [facility] derm- [facility]    Patient Active Problem List   Diagnosis Date Noted  . Healthcare maintenance 10/18/2016  . Osteopenia 10/29/2015  . Advance care planning 08/05/2014  . Short gut syndrome 08/05/2014  . Medicare annual wellness visit, subsequent  08/13/2012  . B12 deficiency 08/13/2012  . History of colon cancer, stage IV 06/16/2008  . Essential hypertension 12/11/2007    Past Surgical History:  Procedure Laterality Date  . ABDOMINAL HYSTERECTOMY    . APPENDECTOMY    . COLOSTOMY     intially 2003, takedown 2006  . ENTEROCUTANEOUS FISTULA CLOSURE    . ILEOSTOMY     take down 2013    Prior to Admission medications   Medication Sig Start Date End Date Taking? Authorizing Provider  Cholestyramine POWD Take 4 g by mouth 3 (three) times daily as needed (with a meal). 10/30/17   Tonia Ghent, MD  cloNIDine (CATAPRES) 0.1 MG tablet Take 2 tablets (0.2 mg total) by mouth 3 (three) times daily. 01/25/18   Delman Kitten, MD  cyanocobalamin (,VITAMIN B-12,) 1000 MCG/ML injection Inject 1 mL (1,000 mcg total) into the muscle every 30 (thirty) days. 01/03/18   Tonia Ghent, MD  diphenoxylate-atropine (LOMOTIL) 2.5-0.025 MG/5ML liquid Take 5 mLs by mouth 4 (four) times daily as needed for diarrhea or loose stools. 03/19/17   Tonia Ghent, MD  NEEDLE, DISP, 25 G (BD ECLIPSE) 25G X 1-1/2" MISC Use monthly with b12 injection Patient not taking: Reported on 01/03/2018 10/16/16   Tonia Ghent, MD  ondansetron (ZOFRAN) 4 MG tablet Take 1 tablet (4 mg total) by mouth every 8 (eight) hours as needed for nausea. 01/04/18   Elsie Stain  S, MD  Psyllium (METAMUCIL PO) Take 5 capsules by mouth daily as needed.    [provider]    Allergies Ivp dye [iodinated diagnostic agents]; Promethazine hcl; Imodium [loperamide]; Metoprolol; Molasses; Propofol [propofol]; and Lisinopril  Family History  Problem Relation Age of Onset  . Dementia Mother   . Stroke Father   . Multiple myeloma Father   . Heart disease Father   . Colon cancer Neg Hx   . Breast cancer Neg Hx     Social History Social History   Tobacco Use  . Smoking status: Former Smoker    Packs/day: 1.50    Years: 30.00    Pack years: 45.00    Types: Cigarettes     Last attempt to quit: 09/04/2004    Years since quitting: 13.4  . Smokeless tobacco: Never Used  Substance Use Topics  . Alcohol use: Yes    Comment: occ beer  . Drug use: No    Review of Systems Constitutional: No fever/chills Eyes: No visual changes. ENT: No sore throat. Cardiovascular: Denies chest pain. Respiratory: Denies shortness of breath. Gastrointestinal: No abdominal pain.  No nausea, no vomiting.  No diarrhea.  No constipation. Genitourinary: Negative for dysuria. Musculoskeletal: Negative for back pain. Skin: Negative for rash. Neurological: Negative for headaches, focal weakness or numbness.    ____________________________________________   PHYSICAL EXAM:  VITAL SIGNS: ED Triage Vitals  Enc Vitals Group     BP 01/25/18 1549 (!) 241/153     Pulse Rate 01/25/18 1549 (!) 101     Resp 01/25/18 1549 16     Temp 01/25/18 1549 98.7 F (37.1 C)     Temp Source 01/25/18 1549 Oral     SpO2 01/25/18 1549 100 %     Weight 01/25/18 1550 166 lb (75.3 kg)     Height --      Head Circumference --      Peak Flow --      Pain Score 01/25/18 1550 0     Pain Loc --      Pain Edu? --      Excl. in [place]? --     Constitutional: Alert and oriented. Well appearing and in no acute distress.  She is very pleasant. Eyes: Conjunctivae are normal. Head: Atraumatic. Nose: No congestion/rhinnorhea. Mouth/Throat: Mucous membranes are moist. Neck: No stridor.   Cardiovascular: Normal rate, regular rhythm. Grossly normal heart sounds.  Good peripheral circulation. Respiratory: Normal respiratory effort.  No retractions. Lungs CTAB. Gastrointestinal: Soft and nontender. No distention. Musculoskeletal: No lower extremity tenderness nor edema. Neurologic:  Normal speech and language. No gross focal neurologic deficits are appreciated.  Skin:  Skin is warm, dry and intact. No rash noted. Psychiatric: Mood and affect are normal. Speech and behavior are  normal.  ____________________________________________   LABS (all labs ordered are listed, but only abnormal results are displayed)  Labs Reviewed  BASIC METABOLIC PANEL - Abnormal; Notable for the following components:      Result Value   Sodium 132 (*)    Chloride 98 (*)    GFR calc non Af Amer 60 (*)    All other components within normal limits  CBC   ____________________________________________  EKG  Reviewed enterotomy at 1610 Heart rate 90 Care is 99 QTC 440 Normal sinus rhythm no evidence of ischemia or ectopy. ____________________________________________  RADIOLOGY   ____________________________________________   PROCEDURES  Procedure(s) performed: None  Procedures  Critical Care performed: No  ____________________________________________   INITIAL IMPRESSION /  ASSESSMENT AND PLAN / ED COURSE  Pertinent labs & imaging results that were available during my care of the patient were reviewed by me and considered in my medical decision making (see chart for details).  Patient presents initially blood pressure registered 241/153.  She is asymptomatic, very reassuring exam.  She has well-documented history of hypertension has tried multiple medications the past currently working with her doctor to utilize clonidine.  She last took a dose at 10 AM this morning, I will give her additional 0.1 mg clonidine here.  On manual check her blood pressure is better, and she does have what she self describes as "bat arms" meaning a fair amount of adipose tissue on the backside of her arms which may be interfering somewhat with her accurate readings.  With a manual pressure 190/120 I still suspect she has notable hypertension, but will plan to use manual pressures to follow with a goal to lower his blood pressure to about 094 systolic, then likely discharge with close outpatient follow-up unless concerns arise on her lab work.  She is completely asymptomatic.  Clinical Course  as of Jan 28 1607  Fri Jan 25, 2018  1636 Spoke with Dr. Damita Dunnings, reports he has sent to have creatinine checked given the elevated BP. Mild hyponatremia excepted. Dr. Damita Dunnings advises change to 0.5m TID or 0.310mBID clonidine and he will follow-up with patient.     [MQ]    Clinical Course User Index [MQ] QuDelman KittenMD     ____________________________________________   FINAL CLINICAL IMPRESSION(S) / ED DIAGNOSES  Final diagnoses:  Hypertension, essential      NEW MEDICATIONS STARTED DURING THIS VISIT:  Discharge Medication List as of 01/25/2018  5:29 PM       Note:  This document was prepared using Dragon voice recognition software and may include unintentional dictation errors.     QuDelman KittenMD 01/27/18 [id number]

## 2018-01-25 NOTE — Telephone Encounter (Signed)
Copied from [place] [phone]. Topic: General - Other >> Jan 25, 2018  4:24 PM Yvette Rack wrote: Reason for CRM: Raynelle Highland with [facility] states pt is at ER and Dr. Jacqualine Code needs to speak with Dr. Damita Dunnings. Cb# [phone]

## 2018-01-25 NOTE — Telephone Encounter (Signed)
Dr Damita Dunnings is speaking with Dr Jacqualine Code at [facility].

## 2018-01-25 NOTE — ED Triage Notes (Signed)
Pt to ED via POV c/o high blood pressure. Pt states that her BP medication was doubled this week by her PCP, BP was 140/83 yesterday but today BP has been very high. Pt denies any symptoms at this time.

## 2018-01-25 NOTE — Telephone Encounter (Signed)
We talked.  Patient has elevated BP, is compliant with meds.  Labs pending.  May need to inc clonidine to 0.2 mg TID vs 0.3mg  BID, either way would be 0.6mg  total per day.   I app help of all involved.

## 2018-01-25 NOTE — Telephone Encounter (Signed)
Only  Symptom was a slight headache at times.Protocall states patient needs to be seen within 4 hours .[facility] notified - Dr Damita Dunnings advised pt to go to the er. Pt advised and will comply   Reason for Disposition . [6] Systolic BP  >= 811 OR Diastolic >= 572  AND [6] having NO cardiac or neurologic symptoms  Answer Assessment - Initial Assessment Questions 1. BLOOD PRESSURE: "What is the blood pressure?" "Did you take at least two measurements 5 minutes apart?"      180/108  Pulse 74  227 /125     2. ONSET: "When did you take your blood pressure?"       1500    3. HOW: "How did you obtain the blood pressure?" (e.g., visiting nurse, automatic home BP monitor)      Home BP   Monitor   4. HISTORY: "Do you have a history of high blood pressure?"      No  5. MEDICATIONS: "Are you taking any medications for blood pressure?" "Have you missed any doses recently?"      Yes   Recently increased  Clonidine  Dose   6. OTHER SYMPTOMS: "Do you have any symptoms?" (e.g., headache, chest pain, blurred vision, difficulty breathing, weakness)       Headache at times   7. PREGNANCY: "Is there any chance you are pregnant?" "When was your last menstrual period?"     n/a  Protocols used: HIGH BLOOD PRESSURE-A-AH

## 2018-01-25 NOTE — Discharge Instructions (Signed)
We are making a change to your clonidine, please change your dose to 0.2 mg every 8 hours.  Please follow up in clinic as recommended in these papers.  Return to the Emergency Department (ED) if you experience any chest pain/pressure/tightness, difficulty breathing, or sudden sweating, or other symptoms that concern you.

## 2018-01-29 ENCOUNTER — Encounter: Payer: Self-pay | Admitting: Family Medicine

## 2018-02-05 ENCOUNTER — Encounter: Payer: Self-pay | Admitting: Family Medicine

## 2018-02-05 ENCOUNTER — Other Ambulatory Visit: Payer: Self-pay | Admitting: Family Medicine

## 2018-02-05 ENCOUNTER — Ambulatory Visit (INDEPENDENT_AMBULATORY_CARE_PROVIDER_SITE_OTHER): Payer: Medicare HMO | Admitting: *Deleted

## 2018-02-05 DIAGNOSIS — E538 Deficiency of other specified B group vitamins: Secondary | ICD-10-CM

## 2018-02-05 MED ORDER — CLONIDINE HCL 0.2 MG PO TABS: 0.2000 mg | ORAL_TABLET | Freq: Three times a day (TID) | ORAL | 5 refills | Status: DC

## 2018-02-05 MED ORDER — CYANOCOBALAMIN 1000 MCG/ML IJ SOLN
1000.0000 ug | Freq: Once | INTRAMUSCULAR | Status: AC
  Administered 2018-02-05: 1000 ug via INTRAMUSCULAR

## 2018-02-05 NOTE — Progress Notes (Signed)
Per orders of Dr. Duncan, injection of b12 given by Kleigh Hoelzer H. Patient tolerated injection well.  

## 2018-02-12 ENCOUNTER — Telehealth: Payer: Self-pay

## 2018-02-12 NOTE — Telephone Encounter (Signed)
Patient called in to request appointment to set up training to self administer Vitamin B12.  Typically, I like to run this by the provider first to be sure there aren't any concerns on your end before I set her up for training.  Just so you are aware, training includes teaching patient how to self-administer B12 via IM injection in the vastus lateralis muscle and then have patient demonstrate back proper technique.  Patients often will argue that they want to self-administer this in their arm; however, that is not safest, best practice as this should be administered IM, not subcu.    Let me know if you are okay with me setting up training.  Thanks.

## 2018-02-13 NOTE — Telephone Encounter (Signed)
Spoke with patient.  Set up training to coincide with next B12 injection on 03/13/18.  Thanks.

## 2018-02-13 NOTE — Telephone Encounter (Signed)
Yes.  Kind and reliable patient who should be able to get trained and do well.  Thanks.

## 2018-02-25 ENCOUNTER — Encounter: Payer: Self-pay | Admitting: Family Medicine

## 2018-03-13 ENCOUNTER — Encounter: Payer: Self-pay | Admitting: Family Medicine

## 2018-03-13 ENCOUNTER — Ambulatory Visit: Payer: Medicare HMO

## 2018-03-14 ENCOUNTER — Ambulatory Visit (INDEPENDENT_AMBULATORY_CARE_PROVIDER_SITE_OTHER): Payer: Medicare HMO | Admitting: *Deleted

## 2018-03-14 DIAGNOSIS — E538 Deficiency of other specified B group vitamins: Secondary | ICD-10-CM

## 2018-03-14 MED ORDER — CYANOCOBALAMIN 1000 MCG/ML IJ SOLN
1000.0000 ug | Freq: Once | INTRAMUSCULAR | Status: AC
  Administered 2018-03-14: 1000 ug via INTRAMUSCULAR

## 2018-03-14 NOTE — Progress Notes (Signed)
Per orders of Dr. Duncan, injection of Vitamin B12 given by Tarryn Bogdan Simpson. Patient tolerated injection well.  

## 2018-04-16 ENCOUNTER — Ambulatory Visit (INDEPENDENT_AMBULATORY_CARE_PROVIDER_SITE_OTHER): Payer: Medicare HMO

## 2018-04-16 DIAGNOSIS — E538 Deficiency of other specified B group vitamins: Secondary | ICD-10-CM | POA: Diagnosis not present

## 2018-04-16 MED ORDER — "SYRINGE/NEEDLE (DISP) 25G X 1"" 3 ML MISC": 3 refills | Status: DC

## 2018-04-16 NOTE — Progress Notes (Signed)
Patient came in for Vitamin B12 self administration training.  Spent approx 45 mins with patient reviewing and practicing technique.  Written handouts/instructions provided for patient to take home to reinforce training.  She is aware that she can call in the future with any questions or concerns.   Patient was able to safely demonstrate preparing and self administering into the Right Vastus Lateralis 37ml of Vitamin B12 IM using a 25g 1 inch needle/ 73ml syringe.  Patient brought her own supply of B12 from the pharmacy.    She will keep a calendar and administer as ordered every 30 days and will keep up with rotating sites.   Patient verbalizes understanding and comfort with all instructions and techniques utilized today, she will call if any further questions or concerns.

## 2018-04-16 NOTE — Addendum Note (Signed)
Addended by: Magdalen Spatz C on: 04/16/2018 12:38 PM   Modules accepted: Orders

## 2018-04-16 NOTE — Addendum Note (Signed)
Addended by: Magdalen Spatz C on: 04/16/2018 12:42 PM   Modules accepted: Orders

## 2018-04-17 DIAGNOSIS — R69 Illness, unspecified: Secondary | ICD-10-CM | POA: Diagnosis not present

## 2018-04-21 ENCOUNTER — Other Ambulatory Visit: Payer: Self-pay | Admitting: Family Medicine

## 2018-04-21 DIAGNOSIS — E538 Deficiency of other specified B group vitamins: Secondary | ICD-10-CM

## 2018-04-21 NOTE — Progress Notes (Signed)
Agree. Thanks

## 2018-07-03 ENCOUNTER — Encounter: Payer: Self-pay | Admitting: Family Medicine

## 2018-07-08 ENCOUNTER — Ambulatory Visit (INDEPENDENT_AMBULATORY_CARE_PROVIDER_SITE_OTHER): Payer: Medicare HMO

## 2018-07-08 ENCOUNTER — Other Ambulatory Visit (INDEPENDENT_AMBULATORY_CARE_PROVIDER_SITE_OTHER): Payer: Medicare HMO

## 2018-07-08 DIAGNOSIS — Z23 Encounter for immunization: Secondary | ICD-10-CM | POA: Diagnosis not present

## 2018-07-08 DIAGNOSIS — E538 Deficiency of other specified B group vitamins: Secondary | ICD-10-CM

## 2018-07-08 LAB — VITAMIN B12: Vitamin B-12: 249 pg/mL (ref 211–911)

## 2018-07-15 ENCOUNTER — Other Ambulatory Visit: Payer: Self-pay | Admitting: Family Medicine

## 2018-07-15 DIAGNOSIS — E538 Deficiency of other specified B group vitamins: Secondary | ICD-10-CM

## 2018-07-15 MED ORDER — CYANOCOBALAMIN 1000 MCG/ML IJ SOLN: INTRAMUSCULAR | Status: DC

## 2018-07-16 ENCOUNTER — Other Ambulatory Visit: Payer: Self-pay | Admitting: *Deleted

## 2018-07-16 DIAGNOSIS — R69 Illness, unspecified: Secondary | ICD-10-CM | POA: Diagnosis not present

## 2018-07-16 MED ORDER — CYANOCOBALAMIN 1000 MCG/ML IJ SOLN: INTRAMUSCULAR | 11 refills | Status: DC

## 2018-07-16 MED ORDER — "SYRINGE/NEEDLE (DISP) 25G X 1"" 3 ML MISC": 3 refills | Status: DC

## 2018-07-23 ENCOUNTER — Other Ambulatory Visit: Payer: Self-pay | Admitting: Family Medicine

## 2018-09-24 DIAGNOSIS — D485 Neoplasm of uncertain behavior of skin: Secondary | ICD-10-CM | POA: Diagnosis not present

## 2018-09-24 DIAGNOSIS — D2262 Melanocytic nevi of left upper limb, including shoulder: Secondary | ICD-10-CM | POA: Diagnosis not present

## 2018-09-24 DIAGNOSIS — D225 Melanocytic nevi of trunk: Secondary | ICD-10-CM | POA: Diagnosis not present

## 2018-09-24 DIAGNOSIS — X32XXXA Exposure to sunlight, initial encounter: Secondary | ICD-10-CM | POA: Diagnosis not present

## 2018-09-24 DIAGNOSIS — Z85828 Personal history of other malignant neoplasm of skin: Secondary | ICD-10-CM | POA: Diagnosis not present

## 2018-09-24 DIAGNOSIS — L570 Actinic keratosis: Secondary | ICD-10-CM | POA: Diagnosis not present

## 2018-09-24 DIAGNOSIS — D0471 Carcinoma in situ of skin of right lower limb, including hip: Secondary | ICD-10-CM | POA: Diagnosis not present

## 2018-09-24 DIAGNOSIS — D2261 Melanocytic nevi of right upper limb, including shoulder: Secondary | ICD-10-CM | POA: Diagnosis not present

## 2018-09-24 DIAGNOSIS — D2272 Melanocytic nevi of left lower limb, including hip: Secondary | ICD-10-CM | POA: Diagnosis not present

## 2018-10-15 ENCOUNTER — Other Ambulatory Visit (INDEPENDENT_AMBULATORY_CARE_PROVIDER_SITE_OTHER): Payer: Medicare HMO

## 2018-10-15 DIAGNOSIS — E538 Deficiency of other specified B group vitamins: Secondary | ICD-10-CM

## 2018-10-15 LAB — VITAMIN B12: Vitamin B-12: 557 pg/mL (ref 211–911)

## 2018-10-16 DIAGNOSIS — D0471 Carcinoma in situ of skin of right lower limb, including hip: Secondary | ICD-10-CM | POA: Diagnosis not present

## 2018-10-17 ENCOUNTER — Encounter: Payer: Self-pay | Admitting: Family Medicine

## 2018-10-22 DIAGNOSIS — R69 Illness, unspecified: Secondary | ICD-10-CM | POA: Diagnosis not present

## 2018-11-16 ENCOUNTER — Other Ambulatory Visit: Payer: Self-pay | Admitting: Family Medicine

## 2018-11-30 ENCOUNTER — Encounter: Payer: Self-pay | Admitting: Family Medicine

## 2018-12-02 MED ORDER — "SYRINGE/NEEDLE (DISP) 25G X 1"" 3 ML MISC": 2 refills | Status: DC

## 2019-01-03 ENCOUNTER — Other Ambulatory Visit: Payer: Self-pay | Admitting: Family Medicine

## 2019-01-03 NOTE — Telephone Encounter (Signed)
Name of Medication: Clonidine Name of Pharmacy: [facility] Last Fill or Written Date and Quantity: 07/23/18 90 tablets with 5 refills. Last Office Visit and Type: 01/03/2018 for [facility] Next Office Visit and Type: No future office visits scheduled except for labs only on 01/13/2019  Please review

## 2019-01-05 NOTE — Telephone Encounter (Signed)
Sent. Would schedule virtual visit when possible.  Thanks.

## 2019-01-06 NOTE — Telephone Encounter (Signed)
lvm pt needs virtual appt scheduled

## 2019-01-08 NOTE — Telephone Encounter (Signed)
Pt is scheduled for 01/16/19 @ 12pm

## 2019-01-13 ENCOUNTER — Encounter: Payer: Self-pay | Admitting: Family Medicine

## 2019-01-13 ENCOUNTER — Other Ambulatory Visit (INDEPENDENT_AMBULATORY_CARE_PROVIDER_SITE_OTHER): Payer: Medicare HMO

## 2019-01-13 ENCOUNTER — Other Ambulatory Visit: Payer: Self-pay | Admitting: Family Medicine

## 2019-01-13 ENCOUNTER — Other Ambulatory Visit: Payer: Self-pay

## 2019-01-13 DIAGNOSIS — Z85038 Personal history of other malignant neoplasm of large intestine: Secondary | ICD-10-CM | POA: Diagnosis not present

## 2019-01-13 DIAGNOSIS — I10 Essential (primary) hypertension: Secondary | ICD-10-CM | POA: Diagnosis not present

## 2019-01-13 DIAGNOSIS — M8580 Other specified disorders of bone density and structure, unspecified site: Secondary | ICD-10-CM

## 2019-01-13 DIAGNOSIS — E538 Deficiency of other specified B group vitamins: Secondary | ICD-10-CM

## 2019-01-13 LAB — LIPID PANEL
Cholesterol: 212 mg/dL — ABNORMAL HIGH (ref 0–200)
HDL: 144.5 mg/dL (ref >39.00)
LDL Cholesterol: 57 mg/dL (ref 0–99)
NonHDL: 67.03
Total CHOL/HDL Ratio: 1
Triglycerides: 52 mg/dL (ref 0.0–149.0)
VLDL: 10.4 mg/dL (ref 0.0–40.0)

## 2019-01-13 LAB — COMPREHENSIVE METABOLIC PANEL
ALT: 13 U/L (ref 0–35)
AST: 19 U/L (ref 0–37)
Albumin: 4.3 g/dL (ref 3.5–5.2)
Alkaline Phosphatase: 60 U/L (ref 39–117)
BUN: 12 mg/dL (ref 6–23)
CO2: 25 mEq/L (ref 19–32)
Calcium: 9.4 mg/dL (ref 8.4–10.5)
Chloride: 92 mEq/L — ABNORMAL LOW (ref 96–112)
Creatinine, Ser: 1.08 mg/dL (ref 0.40–1.20)
GFR: 50.8 mL/min — ABNORMAL LOW (ref >60.00)
Glucose, Bld: 94 mg/dL (ref 70–99)
Potassium: 4.3 mEq/L (ref 3.5–5.1)
Sodium: 126 mEq/L — ABNORMAL LOW (ref 135–145)
Total Bilirubin: 0.7 mg/dL (ref 0.2–1.2)
Total Protein: 6.9 g/dL (ref 6.0–8.3)

## 2019-01-13 LAB — CBC WITH DIFFERENTIAL/PLATELET
Basophils Absolute: 0 10*3/uL (ref 0.0–0.1)
Basophils Relative: 1 % (ref 0.0–3.0)
Eosinophils Absolute: 0.2 10*3/uL (ref 0.0–0.7)
Eosinophils Relative: 3.5 % (ref 0.0–5.0)
HCT: 39.2 % (ref 36.0–46.0)
Hemoglobin: 13.2 g/dL (ref 12.0–15.0)
Lymphocytes Relative: 18.3 % (ref 12.0–46.0)
Lymphs Abs: 0.9 10*3/uL (ref 0.7–4.0)
MCHC: 33.6 g/dL (ref 30.0–36.0)
MCV: 87.1 fl (ref 78.0–100.0)
Monocytes Absolute: 0.7 10*3/uL (ref 0.1–1.0)
Monocytes Relative: 14 % — ABNORMAL HIGH (ref 3.0–12.0)
Neutro Abs: 3 10*3/uL (ref 1.4–7.7)
Neutrophils Relative %: 63.2 % (ref 43.0–77.0)
Platelets: 272 10*3/uL (ref 150.0–400.0)
RBC: 4.5 Mil/uL (ref 3.87–5.11)
RDW: 14.1 % (ref 11.5–15.5)
WBC: 4.7 10*3/uL (ref 4.0–10.5)

## 2019-01-13 LAB — VITAMIN B12: Vitamin B-12: 536 pg/mL (ref 211–911)

## 2019-01-13 LAB — VITAMIN D 25 HYDROXY (VIT D DEFICIENCY, FRACTURES): VITD: 21.74 ng/mL — ABNORMAL LOW (ref 30.00–100.00)

## 2019-01-15 LAB — CEA: CEA: 2.7 ng/mL — ABNORMAL HIGH

## 2019-01-16 ENCOUNTER — Encounter: Payer: Self-pay | Admitting: Family Medicine

## 2019-01-16 ENCOUNTER — Ambulatory Visit (INDEPENDENT_AMBULATORY_CARE_PROVIDER_SITE_OTHER): Payer: Medicare HMO | Admitting: Family Medicine

## 2019-01-16 VITALS — BP 124/80 | Ht 66.0 in | Wt 153.0 lb

## 2019-01-16 DIAGNOSIS — Z7189 Other specified counseling: Secondary | ICD-10-CM

## 2019-01-16 DIAGNOSIS — I10 Essential (primary) hypertension: Secondary | ICD-10-CM | POA: Diagnosis not present

## 2019-01-16 DIAGNOSIS — Z85038 Personal history of other malignant neoplasm of large intestine: Secondary | ICD-10-CM | POA: Diagnosis not present

## 2019-01-16 DIAGNOSIS — Z0000 Encounter for general adult medical examination without abnormal findings: Secondary | ICD-10-CM

## 2019-01-16 DIAGNOSIS — E538 Deficiency of other specified B group vitamins: Secondary | ICD-10-CM | POA: Diagnosis not present

## 2019-01-16 DIAGNOSIS — E559 Vitamin D deficiency, unspecified: Secondary | ICD-10-CM | POA: Diagnosis not present

## 2019-01-16 MED ORDER — "SYRINGE/NEEDLE (DISP) 25G X 1"" 3 ML MISC": 12 refills | Status: DC

## 2019-01-16 MED ORDER — CLONIDINE HCL 0.2 MG PO TABS: 0.2000 mg | ORAL_TABLET | Freq: Three times a day (TID) | ORAL | 3 refills | Status: DC

## 2019-01-16 MED ORDER — CYANOCOBALAMIN 1000 MCG/ML IJ SOLN: INTRAMUSCULAR | 12 refills | Status: DC

## 2019-01-16 MED ORDER — ONDANSETRON HCL 4 MG PO TABS: 4.0000 mg | ORAL_TABLET | Freq: Three times a day (TID) | ORAL | 12 refills | Status: DC | PRN

## 2019-01-16 NOTE — Progress Notes (Signed)
Interactive audio and video telecommunications were attempted between this provider and patient, however failed, due to patient having technical difficulties OR patient did not have access to video capability.  We continued and completed visit with audio only.   Virtual Visit via Telephone Note  I connected with patient on 01/16/19 at 12:20 PM by telephone and verified that I am speaking with the correct person using two identifiers.  Location of patient: home  Location of MD: [place] Name of referring provider (if blank then none associated): Names per persons and role in encounter:  MD: Earlyne Iba, Patient: name listed above.    I discussed the limitations, risks, security and privacy concerns of performing an evaluation and management service by telephone and the availability of in person appointments. I also discussed with the patient that there may be a patient responsible charge related to this service. The patient expressed understanding and agreed to proceed.  CC: follow up.   HPI:  Intentional weight loss on lower carb and higher protein diet.    B12 def. Short gut hx noted, d/w pt.  She needs a 25g needle for comfort, d/w pt.  rx sent.  Would continue twice monthly replacement.    H/o colon cancer.  CEA not elevated.  D/w pt. no new symptoms.  No blood in stool.  Vit D def.  Labs d/w pt.  She is going to restart vit D supplement as tolerated and see how much she can tolerate.   Hypertension:    Using medication without problems or lightheadedness: yes Chest pain with exertion:no Edema:no Short of breath:no Labs d/w pt.   Hep C screening - completed Tetanus 2013 Flu vaccine - prev done.   Shingles - d/w pt.  PNA vaccine can be done when feasible.  Mammogram to be done when possible, given pandemic in 2020.  DXA- deferred given pandemic in 2020.  Advance directive- oldest sister Milinda Hirschfeld designated if patient were incapacitated.   Past medical history,  social history, family history discussed with patient.  Observations/Objective:nad Speech wnl.    Assessment and Plan: Intentional weight loss on lower carb and higher protein diet.    B12 def. Short gut hx noted, d/w pt.  She needs a 25g needle for comfort, d/w pt.  rx sent.  Would continue twice monthly replacement.    H/o colon cancer.  CEA not elevated.  D/w pt. no new symptoms.  No blood in stool.  Vit D def.  Labs d/w pt.  She is going to restart vit D supplement as tolerated and see how much she can tolerate.   Hypertension:    Continue as is.  No change in meds.  She agrees. Labs d/w pt.   Hep C screening - completed previously. Tetanus 2013 Flu vaccine - prev done.   Shingles - d/w pt.  PNA vaccine can be done when feasible.  Mammogram to be done when possible, given pandemic in 2020.  DXA- deferred given pandemic in 2020.  Advance directive- oldest sister Milinda Hirschfeld designated if patient were incapacitated.   Follow Up Instructions: See above. I discussed the assessment and treatment plan with the patient. The patient was provided an opportunity to ask questions and all were answered. The patient agreed with the plan and demonstrated an understanding of the instructions.   The patient was advised to call back or seek an in-person evaluation if the symptoms worsen or if the condition fails to improve as anticipated.  I provided  25 minutes of non-face-to-face time during this encounter.  Elsie Stain, MD

## 2019-01-19 DIAGNOSIS — E559 Vitamin D deficiency, unspecified: Secondary | ICD-10-CM | POA: Insufficient documentation

## 2019-01-19 NOTE — Assessment & Plan Note (Signed)
Hep C screening - completed previously. Tetanus 2013 Flu vaccine - prev done.   Shingles - d/w pt.  PNA vaccine can be done when feasible.  Mammogram to be done when possible, given pandemic in 2020.  DXA- deferred given pandemic in 2020.  Advance directive- oldest sister Milinda Hirschfeld designated if patient were incapacitated.

## 2019-01-19 NOTE — Assessment & Plan Note (Signed)
H/o colon cancer.  CEA not elevated.  D/w pt. no new symptoms.  No blood in stool.

## 2019-01-19 NOTE — Assessment & Plan Note (Signed)
B12 def. Short gut hx noted, d/w pt.  She needs a 25g needle for comfort, d/w pt.  rx sent.  Would continue twice monthly replacement.

## 2019-01-19 NOTE — Assessment & Plan Note (Signed)
Vit D def.  Labs d/w pt.  She is going to restart vit D supplement as tolerated and see how much she can tolerate.

## 2019-01-19 NOTE — Assessment & Plan Note (Signed)
Continue as is.  No change in meds.  She agrees. Labs d/w pt.

## 2019-01-19 NOTE — Assessment & Plan Note (Signed)
Advance directive- oldest sister Deb Howard designated if patient were incapacitated. 

## 2019-05-15 DIAGNOSIS — R69 Illness, unspecified: Secondary | ICD-10-CM | POA: Diagnosis not present

## 2019-05-20 ENCOUNTER — Ambulatory Visit: Payer: Medicare HMO | Admitting: Family Medicine

## 2019-05-20 ENCOUNTER — Ambulatory Visit (INDEPENDENT_AMBULATORY_CARE_PROVIDER_SITE_OTHER): Payer: Medicare HMO

## 2019-05-20 DIAGNOSIS — Z23 Encounter for immunization: Secondary | ICD-10-CM | POA: Diagnosis not present

## 2019-08-27 DIAGNOSIS — R69 Illness, unspecified: Secondary | ICD-10-CM | POA: Diagnosis not present

## 2019-09-25 ENCOUNTER — Other Ambulatory Visit: Payer: Self-pay | Admitting: Family Medicine

## 2019-10-23 ENCOUNTER — Encounter: Payer: Self-pay | Admitting: Family Medicine

## 2019-12-08 ENCOUNTER — Ambulatory Visit: Payer: Self-pay | Attending: Internal Medicine

## 2019-12-08 DIAGNOSIS — Z23 Encounter for immunization: Secondary | ICD-10-CM

## 2019-12-08 NOTE — Progress Notes (Signed)
   Covid-19 Vaccination Clinic  Name:  Madeline Park    MRN: [id number] DOB: 1950-11-22  12/08/2019  [name] was observed post Covid-19 immunization for 15 minutes without incident. She was provided with Vaccine Information Sheet and instruction to access the V-Safe system.   [name] was instructed to call 911 with any severe reactions post vaccine: Marland Kitchen Difficulty breathing  . Swelling of face and throat  . A fast heartbeat  . A bad rash all over body  . Dizziness and weakness   Immunizations Administered    Name Date Dose VIS Date Route   Pfizer COVID-19 Vaccine 12/08/2019 11:59 AM 0.3 mL 08/15/2019 Intramuscular   Manufacturer: [place]   Lot: [id number]   [place]: [id number]

## 2019-12-17 ENCOUNTER — Other Ambulatory Visit: Payer: Self-pay | Admitting: Family Medicine

## 2019-12-17 NOTE — Telephone Encounter (Signed)
Sent. Thanks.   

## 2019-12-17 NOTE — Telephone Encounter (Signed)
Electronic refill request Questran Last refill 09/25/19  378 g/1 refill Last office visit 01/16/19

## 2019-12-30 ENCOUNTER — Ambulatory Visit: Payer: Self-pay | Attending: Internal Medicine

## 2019-12-30 DIAGNOSIS — Z23 Encounter for immunization: Secondary | ICD-10-CM

## 2019-12-30 NOTE — Progress Notes (Signed)
   Covid-19 Vaccination Clinic  Name:  Madeline Park    MRN: [id number] DOB: October 02, 1950  12/30/2019  [name] was observed post Covid-19 immunization for 15 minutes without incident. She was provided with Vaccine Information Sheet and instruction to access the V-Safe system.   [name] was instructed to call 911 with any severe reactions post vaccine: Marland Kitchen Difficulty breathing  . Swelling of face and throat  . A fast heartbeat  . A bad rash all over body  . Dizziness and weakness   Immunizations Administered    Name Date Dose VIS Date Route   Pfizer COVID-19 Vaccine 12/30/2019  9:56 AM 0.3 mL 10/29/2018 Intramuscular   Manufacturer: [place]   Lot: [id number]   [place]: [id number]

## 2020-01-19 ENCOUNTER — Other Ambulatory Visit: Payer: Self-pay | Admitting: Family Medicine

## 2020-01-24 ENCOUNTER — Other Ambulatory Visit: Payer: Self-pay | Admitting: Family Medicine

## 2020-01-26 NOTE — Telephone Encounter (Signed)
Called patient but line was busy and couldn't leave voicemail. Patient still needs to be scheduled for CPE and labs.

## 2020-01-26 NOTE — Telephone Encounter (Signed)
Please call patient and schedule annual physical. Send back to CMA for refill after appointment has been scheduled.

## 2020-01-27 NOTE — Telephone Encounter (Signed)
Please send back for refill after appointment is scheduled.

## 2020-01-28 NOTE — Telephone Encounter (Signed)
Sent. Thanks.   

## 2020-01-28 NOTE — Telephone Encounter (Signed)
Called patient and got her scheduled for CPE and labs. °

## 2020-01-28 NOTE — Telephone Encounter (Signed)
Refill request Zofran Last refill 01/16/19 #40/12 Last office visit 01/16/19 Upcoming appointment 02/17/20

## 2020-02-02 ENCOUNTER — Other Ambulatory Visit: Payer: Self-pay | Admitting: Family Medicine

## 2020-02-02 DIAGNOSIS — E538 Deficiency of other specified B group vitamins: Secondary | ICD-10-CM

## 2020-02-02 DIAGNOSIS — I10 Essential (primary) hypertension: Secondary | ICD-10-CM

## 2020-02-02 DIAGNOSIS — Z85038 Personal history of other malignant neoplasm of large intestine: Secondary | ICD-10-CM

## 2020-02-02 DIAGNOSIS — E559 Vitamin D deficiency, unspecified: Secondary | ICD-10-CM

## 2020-02-08 ENCOUNTER — Other Ambulatory Visit: Payer: Self-pay | Admitting: Family Medicine

## 2020-02-08 DIAGNOSIS — Z85038 Personal history of other malignant neoplasm of large intestine: Secondary | ICD-10-CM

## 2020-02-10 ENCOUNTER — Other Ambulatory Visit: Payer: Self-pay

## 2020-02-10 ENCOUNTER — Other Ambulatory Visit (INDEPENDENT_AMBULATORY_CARE_PROVIDER_SITE_OTHER): Payer: Medicare Other

## 2020-02-10 DIAGNOSIS — Z85038 Personal history of other malignant neoplasm of large intestine: Secondary | ICD-10-CM | POA: Diagnosis not present

## 2020-02-10 DIAGNOSIS — E559 Vitamin D deficiency, unspecified: Secondary | ICD-10-CM

## 2020-02-10 DIAGNOSIS — I10 Essential (primary) hypertension: Secondary | ICD-10-CM

## 2020-02-10 DIAGNOSIS — E538 Deficiency of other specified B group vitamins: Secondary | ICD-10-CM

## 2020-02-10 LAB — COMPREHENSIVE METABOLIC PANEL
ALT: 12 U/L (ref 0–35)
AST: 19 U/L (ref 0–37)
Albumin: 4.2 g/dL (ref 3.5–5.2)
Alkaline Phosphatase: 76 U/L (ref 39–117)
BUN: 15 mg/dL (ref 6–23)
CO2: 28 mEq/L (ref 19–32)
Calcium: 9.4 mg/dL (ref 8.4–10.5)
Chloride: 97 mEq/L (ref 96–112)
Creatinine, Ser: 1.2 mg/dL (ref 0.40–1.20)
GFR: 44.83 mL/min — ABNORMAL LOW (ref >60.00)
Glucose, Bld: 96 mg/dL (ref 70–99)
Potassium: 4.2 mEq/L (ref 3.5–5.1)
Sodium: 131 mEq/L — ABNORMAL LOW (ref 135–145)
Total Bilirubin: 0.9 mg/dL (ref 0.2–1.2)
Total Protein: 7 g/dL (ref 6.0–8.3)

## 2020-02-10 LAB — LIPID PANEL
Cholesterol: 226 mg/dL — ABNORMAL HIGH (ref 0–200)
HDL: 127.1 mg/dL (ref >39.00)
LDL Cholesterol: 81 mg/dL (ref 0–99)
NonHDL: 99.04
Total CHOL/HDL Ratio: 2
Triglycerides: 91 mg/dL (ref 0.0–149.0)
VLDL: 18.2 mg/dL (ref 0.0–40.0)

## 2020-02-10 LAB — CBC WITH DIFFERENTIAL/PLATELET
Basophils Absolute: 0 10*3/uL (ref 0.0–0.1)
Basophils Relative: 0.5 % (ref 0.0–3.0)
Eosinophils Absolute: 0.1 10*3/uL (ref 0.0–0.7)
Eosinophils Relative: 1.8 % (ref 0.0–5.0)
HCT: 41.9 % (ref 36.0–46.0)
Hemoglobin: 14 g/dL (ref 12.0–15.0)
Lymphocytes Relative: 12.1 % (ref 12.0–46.0)
Lymphs Abs: 0.8 10*3/uL (ref 0.7–4.0)
MCHC: 33.3 g/dL (ref 30.0–36.0)
MCV: 85.4 fl (ref 78.0–100.0)
Monocytes Absolute: 0.7 10*3/uL (ref 0.1–1.0)
Monocytes Relative: 10.4 % (ref 3.0–12.0)
Neutro Abs: 5.1 10*3/uL (ref 1.4–7.7)
Neutrophils Relative %: 75.2 % (ref 43.0–77.0)
Platelets: 262 10*3/uL (ref 150.0–400.0)
RBC: 4.91 Mil/uL (ref 3.87–5.11)
RDW: 14.2 % (ref 11.5–15.5)
WBC: 6.7 10*3/uL (ref 4.0–10.5)

## 2020-02-10 LAB — VITAMIN B12: Vitamin B-12: 372 pg/mL (ref 211–911)

## 2020-02-10 LAB — VITAMIN D 25 HYDROXY (VIT D DEFICIENCY, FRACTURES): VITD: 17.22 ng/mL — ABNORMAL LOW (ref 30.00–100.00)

## 2020-02-11 LAB — CEA: CEA: 2.1 ng/mL

## 2020-02-17 ENCOUNTER — Other Ambulatory Visit: Payer: Self-pay | Admitting: Family Medicine

## 2020-02-17 ENCOUNTER — Telehealth: Payer: Self-pay | Admitting: Family Medicine

## 2020-02-17 ENCOUNTER — Encounter: Payer: Self-pay | Admitting: Family Medicine

## 2020-02-17 NOTE — Telephone Encounter (Signed)
Noted. Thanks.

## 2020-02-17 NOTE — Telephone Encounter (Signed)
-----   Message from Josetta Huddle, [place] sent at 02/17/2020 10:51 AM EDT ----- Patient says she is fine.  She just simply forgot her appointment and will reschedule. ----- Message ----- From: Tonia Ghent, MD Sent: 02/17/2020  10:35 AM EDT To: Josetta Huddle, CMA  Please check on patient about follow.  Thanks.   Brigitte Pulse

## 2020-02-20 ENCOUNTER — Encounter: Payer: Self-pay | Admitting: Family Medicine

## 2020-02-20 ENCOUNTER — Ambulatory Visit (INDEPENDENT_AMBULATORY_CARE_PROVIDER_SITE_OTHER): Payer: Medicare Other | Admitting: Family Medicine

## 2020-02-20 ENCOUNTER — Other Ambulatory Visit: Payer: Self-pay

## 2020-02-20 VITALS — BP 178/138 | HR 99 | Temp 95.4°F | Ht 66.0 in | Wt 155.3 lb

## 2020-02-20 DIAGNOSIS — Z0000 Encounter for general adult medical examination without abnormal findings: Secondary | ICD-10-CM

## 2020-02-20 DIAGNOSIS — Z7189 Other specified counseling: Secondary | ICD-10-CM

## 2020-02-20 DIAGNOSIS — E569 Vitamin deficiency, unspecified: Secondary | ICD-10-CM

## 2020-02-20 DIAGNOSIS — E538 Deficiency of other specified B group vitamins: Secondary | ICD-10-CM | POA: Diagnosis not present

## 2020-02-20 DIAGNOSIS — E559 Vitamin D deficiency, unspecified: Secondary | ICD-10-CM | POA: Diagnosis not present

## 2020-02-20 DIAGNOSIS — Z0001 Encounter for general adult medical examination with abnormal findings: Secondary | ICD-10-CM | POA: Diagnosis not present

## 2020-02-20 DIAGNOSIS — I10 Essential (primary) hypertension: Secondary | ICD-10-CM | POA: Diagnosis not present

## 2020-02-20 DIAGNOSIS — Z23 Encounter for immunization: Secondary | ICD-10-CM | POA: Diagnosis not present

## 2020-02-20 DIAGNOSIS — Z85038 Personal history of other malignant neoplasm of large intestine: Secondary | ICD-10-CM | POA: Diagnosis not present

## 2020-02-20 MED ORDER — CLONIDINE HCL 0.2 MG PO TABS: 0.2000 mg | ORAL_TABLET | Freq: Three times a day (TID) | ORAL | 3 refills | Status: DC

## 2020-02-20 MED ORDER — "SYRINGE/NEEDLE (DISP) 25G X 1"" 3 ML MISC": 12 refills | Status: DC

## 2020-02-20 MED ORDER — VITAMIN D (ERGOCALCIFEROL) 1.25 MG (50000 UNIT) PO CAPS
50000.0000 [IU] | ORAL_CAPSULE | ORAL | 0 refills | Status: AC
Start: 2020-02-20 — End: ?

## 2020-02-20 NOTE — Progress Notes (Signed)
This visit occurred during the SARS-CoV-2 public health emergency.  Safety protocols were in place, including screening questions prior to the visit, additional usage of staff PPE, and extensive cleaning of exam room while observing appropriate contact time as indicated for disinfecting solutions.  I have personally reviewed the Medicare Annual Wellness questionnaire and have noted 1. The patient's medical and social history 2. Their use of alcohol, tobacco or illicit drugs 3. Their current medications and supplements 4. The patient's functional ability including ADL's, fall risks, home safety risks and hearing or visual             impairment. 5. Diet and physical activities 6. Evidence for depression or mood disorders  The patients weight, height, BMI have been recorded in the chart and visual acuity is per eye clinic.  I have made referrals, counseling and provided education to the patient based review of the above and I have provided the pt with a written personalized care plan for preventive services.  Provider list updated- see scanned forms.  Routine anticipatory guidance given to patient.  See health maintenance. The possibility exists that previously documented standard health maintenance information may have been brought forward from a previous encounter into this note.  If needed, that same information has been updated to reflect the current situation based on today's encounter.    Flu yearly Shingles discussed with patient PNA 2021 Tetanus 2013 Covid 2021 Colon 2018 Breast cancer screening discussed with patient.  See after visit summary. Reasonable to defer bone density 2021 until we can get vitamin D back to normal.  Discussed. Advance directive- oldest sister Milinda Hirschfeld designated if patient were incapacitated. Cognitive function addressed- see scanned forms- and if abnormal then additional documentation follows.   Hypertension:    Using medication without problems or  lightheadedness: yes Chest pain with exertion:no Edema:no Short of breath:no Average home BPs:not checked.  Had been off clonidine 0.2mg  TID.  D/w pt. previously was compliant with medication.  H/o B12 def. B12 wnl.  Reasonable to continue 2x per month.  She isn't having trouble with self administration.    Low vit D d/w pt.  D/w pt about replacement and recheck in about 3 months.    H/o colon cancer.  CEA wnl.  D/w pt.  Low sodium at baseline.  GI situation d/w pt.  She is tolerating her situation and is careful about her diet.  Still using questran.  She is limiting her trigger foods.  She can eat more fruit than prev.  No blood in stool.    PMH and SH reviewed  Meds, vitals, and allergies reviewed.   ROS: Per HPI.  Unless specifically indicated otherwise in HPI, the patient denies:  General: fever. Eyes: acute vision changes ENT: sore throat Cardiovascular: chest pain Respiratory: SOB GI: vomiting  GU: dysuria Musculoskeletal: acute back pain Derm: acute rash Neuro: acute motor dysfunction Psych: worsening mood Endocrine: polydipsia Heme: bleeding Allergy: hayfever  GEN: nad, alert and oriented HEENT: NCAT NECK: supple w/o LA CV: rrr. PULM: ctab, no inc wob ABD: soft, +bs EXT: no edema SKIN: no acute rash

## 2020-02-20 NOTE — Patient Instructions (Addendum)
Check with your insurance to see if they will cover the shingrix shot.  Please call [facility] about a follow up colonoscopy.    You can call for a mammogram at the [facility] weekly and then recheck in about 3 months at a lab visit.   Restart clonidine twice a day for 5 days.  Then increase back to 3 times a day.  Update me about your BP next week.    Take care.  Glad to see you.

## 2020-02-22 NOTE — Assessment & Plan Note (Signed)
Advance directive- oldest sister Milinda Hirschfeld designated if patient were incapacitated.

## 2020-02-22 NOTE — Assessment & Plan Note (Signed)
H/o B12 def. B12 wnl.  Reasonable to continue 2x per month.  She isn't having trouble with self administration.  Continue as is.

## 2020-02-22 NOTE — Assessment & Plan Note (Signed)
Low vit D d/w pt.  D/w pt about replacement and recheck in about 3 months.

## 2020-02-22 NOTE — Assessment & Plan Note (Signed)
Flu yearly Shingles discussed with patient PNA 2021 Tetanus 2013 Covid 2021 Colon 2018 Breast cancer screening discussed with patient.  See after visit summary. Reasonable to defer bone density 2021 until we can get vitamin D back to normal.  Discussed. Advance directive- oldest sister Milinda Hirschfeld designated if patient were incapacitated. Cognitive function addressed- see scanned forms- and if abnormal then additional documentation follows.

## 2020-02-22 NOTE — Assessment & Plan Note (Signed)
H/o colon cancer.  CEA wnl.  D/w pt.  Low sodium at baseline.  GI situation d/w pt.  She is tolerating her situation and is careful about her diet.  Still using questran.  She is limiting her trigger foods.  She can eat more fruit than prev.  No blood in stool.  Continue as is.  She agrees.

## 2020-02-22 NOTE — Assessment & Plan Note (Signed)
Restart clonidine at twice daily and then increase to 3 times daily thereafter.  See after visit summary.  She can update me about her blood pressure.  Labs discussed with patient.

## 2020-03-03 ENCOUNTER — Encounter: Payer: Self-pay | Admitting: Emergency Medicine

## 2020-03-03 ENCOUNTER — Emergency Department: Payer: Medicare Other

## 2020-03-03 ENCOUNTER — Inpatient Hospital Stay
Admission: EM | Admit: 2020-03-03 | Discharge: 2020-03-13 | DRG: 065 | Disposition: A | Discharge status: Skilled Nursing Facility | Payer: Medicare Other | Attending: Internal Medicine | Admitting: Internal Medicine

## 2020-03-03 ENCOUNTER — Other Ambulatory Visit: Payer: Self-pay

## 2020-03-03 DIAGNOSIS — R29810 Facial weakness: Secondary | ICD-10-CM | POA: Diagnosis present

## 2020-03-03 DIAGNOSIS — E876 Hypokalemia: Secondary | ICD-10-CM | POA: Diagnosis present

## 2020-03-03 DIAGNOSIS — E871 Hypo-osmolality and hyponatremia: Secondary | ICD-10-CM | POA: Diagnosis present

## 2020-03-03 DIAGNOSIS — Z20822 Contact with and (suspected) exposure to covid-19: Secondary | ICD-10-CM | POA: Diagnosis present

## 2020-03-03 DIAGNOSIS — Z933 Colostomy status: Secondary | ICD-10-CM

## 2020-03-03 DIAGNOSIS — I10 Essential (primary) hypertension: Secondary | ICD-10-CM | POA: Diagnosis present

## 2020-03-03 DIAGNOSIS — Z90710 Acquired absence of both cervix and uterus: Secondary | ICD-10-CM

## 2020-03-03 DIAGNOSIS — Z85048 Personal history of other malignant neoplasm of rectum, rectosigmoid junction, and anus: Secondary | ICD-10-CM

## 2020-03-03 DIAGNOSIS — I639 Cerebral infarction, unspecified: Principal | ICD-10-CM | POA: Diagnosis present

## 2020-03-03 DIAGNOSIS — Z66 Do not resuscitate: Secondary | ICD-10-CM | POA: Diagnosis present

## 2020-03-03 DIAGNOSIS — R29703 NIHSS score 3: Secondary | ICD-10-CM | POA: Diagnosis present

## 2020-03-03 DIAGNOSIS — I739 Peripheral vascular disease, unspecified: Secondary | ICD-10-CM | POA: Diagnosis present

## 2020-03-03 DIAGNOSIS — I161 Hypertensive emergency: Secondary | ICD-10-CM | POA: Diagnosis present

## 2020-03-03 DIAGNOSIS — Z85828 Personal history of other malignant neoplasm of skin: Secondary | ICD-10-CM

## 2020-03-03 DIAGNOSIS — E538 Deficiency of other specified B group vitamins: Secondary | ICD-10-CM | POA: Diagnosis present

## 2020-03-03 DIAGNOSIS — Z87891 Personal history of nicotine dependence: Secondary | ICD-10-CM

## 2020-03-03 DIAGNOSIS — E785 Hyperlipidemia, unspecified: Secondary | ICD-10-CM | POA: Diagnosis present

## 2020-03-03 DIAGNOSIS — Z79899 Other long term (current) drug therapy: Secondary | ICD-10-CM

## 2020-03-03 DIAGNOSIS — G8191 Hemiplegia, unspecified affecting right dominant side: Secondary | ICD-10-CM | POA: Diagnosis present

## 2020-03-03 DIAGNOSIS — Z9049 Acquired absence of other specified parts of digestive tract: Secondary | ICD-10-CM

## 2020-03-03 DIAGNOSIS — R4701 Aphasia: Secondary | ICD-10-CM | POA: Diagnosis present

## 2020-03-03 DIAGNOSIS — N179 Acute kidney failure, unspecified: Secondary | ICD-10-CM | POA: Diagnosis present

## 2020-03-03 HISTORY — DX: Essential (primary) hypertension: I10

## 2020-03-03 LAB — DIFFERENTIAL
Abs Immature Granulocytes: 0.04 10*3/uL (ref 0.00–0.07)
Basophils Absolute: 0 10*3/uL (ref 0.0–0.1)
Basophils Relative: 1 %
Eosinophils Absolute: 0.1 10*3/uL (ref 0.0–0.5)
Eosinophils Relative: 2 %
Immature Granulocytes: 1 %
Lymphocytes Relative: 19 %
Lymphs Abs: 1.3 10*3/uL (ref 0.7–4.0)
Monocytes Absolute: 0.7 10*3/uL (ref 0.1–1.0)
Monocytes Relative: 10 %
Neutro Abs: 4.6 10*3/uL (ref 1.7–7.7)
Neutrophils Relative %: 67 %

## 2020-03-03 LAB — URINALYSIS, COMPLETE (UACMP) WITH MICROSCOPIC
Bacteria, UA: NONE SEEN
Bilirubin Urine: NEGATIVE
Glucose, UA: NEGATIVE mg/dL
Hgb urine dipstick: NEGATIVE
Ketones, ur: NEGATIVE mg/dL
Leukocytes,Ua: NEGATIVE
Nitrite: NEGATIVE
Protein, ur: NEGATIVE mg/dL
Specific Gravity, Urine: 1.008 (ref 1.005–1.030)
pH: 6 (ref 5.0–8.0)

## 2020-03-03 LAB — COMPREHENSIVE METABOLIC PANEL
ALT: 16 U/L (ref 0–44)
AST: 20 U/L (ref 15–41)
Albumin: 4.2 g/dL (ref 3.5–5.0)
Alkaline Phosphatase: 59 U/L (ref 38–126)
Anion gap: 9 (ref 5–15)
BUN: 24 mg/dL — ABNORMAL HIGH (ref 8–23)
CO2: 26 mmol/L (ref 22–32)
Calcium: 9.3 mg/dL (ref 8.9–10.3)
Chloride: 101 mmol/L (ref 98–111)
Creatinine, Ser: 1.43 mg/dL — ABNORMAL HIGH (ref 0.44–1.00)
GFR calc Af Amer: 44 mL/min — ABNORMAL LOW (ref >60)
GFR calc non Af Amer: 38 mL/min — ABNORMAL LOW (ref >60)
Glucose, Bld: 97 mg/dL (ref 70–99)
Potassium: 3.3 mmol/L — ABNORMAL LOW (ref 3.5–5.1)
Sodium: 136 mmol/L (ref 135–145)
Total Bilirubin: 1.4 mg/dL — ABNORMAL HIGH (ref 0.3–1.2)
Total Protein: 7.1 g/dL (ref 6.5–8.1)

## 2020-03-03 LAB — APTT: aPTT: 38 seconds — ABNORMAL HIGH (ref 24–36)

## 2020-03-03 LAB — TROPONIN I (HIGH SENSITIVITY)
Troponin I (High Sensitivity): 46 ng/L — ABNORMAL HIGH (ref <18)
Troponin I (High Sensitivity): 46 ng/L — ABNORMAL HIGH (ref <18)

## 2020-03-03 LAB — CBC
HCT: 40.5 % (ref 36.0–46.0)
Hemoglobin: 13.5 g/dL (ref 12.0–15.0)
MCH: 28.5 pg (ref 26.0–34.0)
MCHC: 33.3 g/dL (ref 30.0–36.0)
MCV: 85.4 fL (ref 80.0–100.0)
Platelets: 306 10*3/uL (ref 150–400)
RBC: 4.74 MIL/uL (ref 3.87–5.11)
RDW: 14.1 % (ref 11.5–15.5)
WBC: 6.7 10*3/uL (ref 4.0–10.5)
nRBC: 0 % (ref 0.0–0.2)

## 2020-03-03 LAB — PROTIME-INR
INR: 1 (ref 0.8–1.2)
Prothrombin Time: 12.9 seconds (ref 11.4–15.2)

## 2020-03-03 MED ORDER — NICARDIPINE HCL IN NACL 20-0.86 MG/200ML-% IV SOLN
3.0000 mg/h | INTRAVENOUS | Status: DC
  Administered 2020-03-03: 5 mg/h via INTRAVENOUS
  Filled 2020-03-03: qty 200

## 2020-03-03 NOTE — ED Provider Notes (Signed)
[facility] Emergency Department Provider Note   ____________________________________________   First MD Initiated Contact with Patient 03/03/20 2018     (approximate)  I have reviewed the triage vital signs and the nursing notes.   HISTORY  Chief Complaint Altered Mental Status and Weakness   HPI Madeline Park is a 69 y.o. female patient is here with her brother.  Brother was saying that she is not acting like herself and has slurry speech and frequent falls.  Patient herself noted slurry speech and her right side is somewhat weak.  She cannot say when this happened.  Apparently this happened within the last week.  Family is not been able to get a hold of her for a week.  Patient herself cannot tell me when it started.  She says she has been falling somewhat last few days.           Past Medical History:  Diagnosis Date  . B12 deficiency   . Cancer [facility])    found after colon perforation in 2003, Rectal, Stage IV; S/P surgery, chemo.  no recurrence since chemo concluded 04,  followed at [facility]  . Fistula   . Hypertension   . Nausea    responds to reglan and zofran together  . Peritonitis ([place])    after colon perforation 2003  . Skin cancer    per [facility] derm- [facility]    Patient Active Problem List   Diagnosis Date Noted  . Vitamin D deficiency 01/19/2019  . Healthcare maintenance 10/18/2016  . Osteopenia 10/29/2015  . Advance care planning 08/05/2014  . Short gut syndrome 08/05/2014  . Medicare annual wellness visit, subsequent 08/13/2012  . Vitamin B12 deficiency 08/13/2012  . History of colon cancer, stage IV 06/16/2008  . Essential hypertension 12/11/2007    Past Surgical History:  Procedure Laterality Date  . ABDOMINAL HYSTERECTOMY    . APPENDECTOMY    . COLOSTOMY     intially 2003, takedown 2006  . ENTEROCUTANEOUS FISTULA CLOSURE    . ILEOSTOMY     take down 2013    Prior to Admission medications   Medication Sig Start Date  End Date Taking? Authorizing Provider  cetirizine (ZYRTEC) 10 MG tablet Take by mouth.   Yes [provider]  cloNIDine (CATAPRES) 0.2 MG tablet Take 1 tablet (0.2 mg total) by mouth 3 (three) times daily. 02/20/20  Yes Tonia Ghent, MD  cyanocobalamin (,VITAMIN B-12,) 1000 MCG/ML injection INJECT 1ML INTO THE MUSCLE TWICE A MONTH. 02/17/20  Yes Tonia Ghent, MD  diphenhydrAMINE (BENADRYL) 25 mg capsule Take by mouth.   Yes [provider]  Multiple Vitamins-Minerals (MULTIVITAMIN WITH MINERALS) tablet Take by mouth.   Yes [provider]  ondansetron (ZOFRAN) 4 MG tablet TAKE 1 TABLET BY MOUTH EVERY 8 HOURS AS NEEDED FOR NAUSEA 01/28/20  Yes Tonia Ghent, MD  Psyllium (METAMUCIL PO) Take 5 capsules by mouth daily as needed.   Yes [provider]  Vitamin D, Ergocalciferol, (DRISDOL) 1.25 MG (50000 UNIT) CAPS capsule Take 1 capsule (50,000 Units total) by mouth every 7 (seven) days. 02/20/20  Yes Tonia Ghent, MD  cholestyramine Lucrezia Starch) 4 GM/DOSE powder TAKE ONE SCOOPFUL BY MOUTH 3 (THREE) TIMES DAILY AS NEEDED (WITH A MEAL). 12/17/19   Tonia Ghent, MD    Allergies Ivp dye [iodinated diagnostic agents], Promethazine hcl, Imodium [loperamide], Metoprolol, Molasses, Propofol [propofol], and Lisinopril  Family History  Problem Relation Age of Onset  . Dementia  Mother   . Stroke Father   . Multiple myeloma Father   . Heart disease Father   . Colon cancer Neg Hx   . Breast cancer Neg Hx     Social History Social History   Tobacco Use  . Smoking status: Former Smoker    Packs/day: 1.50    Years: 30.00    Pack years: 45.00    Types: Cigarettes    Quit date: 09/04/2004    Years since quitting: 15.5  . Smokeless tobacco: Never Used  Substance Use Topics  . Alcohol use: Yes    Comment: occ beer  . Drug use: No    Review of Systems  Constitutional: No fever/chills Eyes: No visual changes. ENT: No sore throat. Cardiovascular:  Denies chest pain. Respiratory: Denies shortness of breath. Gastrointestinal: No abdominal pain.  No nausea, no vomiting.  No diarrhea.  No constipation. Genitourinary: Negative for dysuria. Musculoskeletal: Negative for back pain. Skin: Negative for rash. Neurological: Negative for headaches, focal weakness per patient on exam this is not true. ____________________________________________   PHYSICAL EXAM:  VITAL SIGNS: ED Triage Vitals  Enc Vitals Group     BP 03/03/20 1844 (!) 244/173     Pulse Rate 03/03/20 1844 87     Resp 03/03/20 1844 18     Temp 03/03/20 1901 97.9 F (36.6 C)     Temp Source 03/03/20 1901 Oral     SpO2 03/03/20 1844 100 %     Weight 03/03/20 1845 150 lb (68 kg)     Height 03/03/20 1845 '5\' 7"'  (1.702 m)     Head Circumference --      Peak Flow --      Pain Score 03/03/20 1844 0     Pain Loc --      Pain Edu? --      Excl. in [place]? --     Constitutional: Alert and oriented. Well appearing and in no acute distress. Eyes: Conjunctivae are normal. PER EOMI. Head: Atraumatic. Nose: No congestion/rhinnorhea. Mouth/Throat: Mucous membranes are moist.  Oropharynx non-erythematous. Neck: No stridor.   Cardiovascular: Normal rate, regular rhythm. Grossly normal heart sounds.  Good peripheral circulation. Respiratory: Normal respiratory effort.  No retractions. Lungs CTAB. Gastrointestinal: Soft and nontender. No distention. No abdominal bruits. No CVA tenderness. Musculoskeletal: No lower extremity tenderness nor edema.  Multiple bruises and some abrasions and some mottling. Neurologic: Speech is somewhat slurry and patient has a little bit of word finding difficulty.  Cranial nerves II through XII appear to be intact but the right arm and leg seem to be weaker than the left.  Patient is right-handed.  There is a little bit of slowing of rapid alternating movements on the right side.  There is no ataxia though. Skin: See  musculoskeletal. .  ____________________________________________   LABS (all labs ordered are listed, but only abnormal results are displayed)  Labs Reviewed  APTT - Abnormal; Notable for the following components:      Result Value   aPTT 38 (*)    All other components within normal limits  COMPREHENSIVE METABOLIC PANEL - Abnormal; Notable for the following components:   Potassium 3.3 (*)    BUN 24 (*)    Creatinine, Ser 1.43 (*)    Total Bilirubin 1.4 (*)    GFR calc non Af Amer 38 (*)    GFR calc Af Amer 44 (*)    All other components within normal limits  URINALYSIS, COMPLETE (UACMP) WITH MICROSCOPIC - Abnormal;  Notable for the following components:   Color, Urine STRAW (*)    APPearance CLEAR (*)    All other components within normal limits  TROPONIN I (HIGH SENSITIVITY) - Abnormal; Notable for the following components:   Troponin I (High Sensitivity) 46 (*)    All other components within normal limits  TROPONIN I (HIGH SENSITIVITY) - Abnormal; Notable for the following components:   Troponin I (High Sensitivity) 46 (*)    All other components within normal limits  PROTIME-INR  CBC  DIFFERENTIAL  CBG MONITORING, ED   ____________________________________________  EKG   ____________________________________________  RADIOLOGY  ED MD interpretation: CT head read by radiology reviewed by me does not show any acute bleeds however there may be infarcts that are possibly subacute. MRI read by radiology reviewed by me shows 2 acute infarcts and some micro hemorrhages. Official radiology report(s): CT HEAD WO CONTRAST  Result Date: 03/03/2020 CLINICAL DATA:  Confusion EXAM: CT HEAD WITHOUT CONTRAST TECHNIQUE: Contiguous axial images were obtained from the base of the skull through the vertex without intravenous contrast. COMPARISON:  None. FINDINGS: Brain: No hemorrhage or intracranial mass is visualized. Extensive white matter hypodensity. Age indeterminate hypodensity  within the left greater than right thalamus and basal ganglia. Mild ventricular enlargement. Vascular: No hyperdense vessels.  Carotid vascular calcification Skull: Normal. Negative for fracture or focal lesion. Sinuses/Orbits: No acute finding. Other: None IMPRESSION: 1. Negative for acute intracranial hemorrhage or intracranial mass lesion. 2. Age indeterminate hypodensity/possible infarcts within the thalamus, basal ganglia and white matter. Advanced chronic small vessel ischemic change of the white matter. MRI could be considered for further evaluation given extensive white matter hypodensity and lack of prior studies for comparison to determine chronicity. Electronically Signed   By: Donavan Foil M.D.   On: 03/03/2020 19:38   MR BRAIN WO CONTRAST  Result Date: 03/04/2020 CLINICAL DATA:  Initial evaluation for acute ataxia, stroke suspected. EXAM: MRI HEAD WITHOUT CONTRAST TECHNIQUE: Multiplanar, multiecho pulse sequences of the brain and surrounding structures were obtained without intravenous contrast. COMPARISON:  Prior CT from earlier the same day. FINDINGS: Brain: Examination moderately degraded by motion artifact. Generalized age-related cerebral atrophy. Extensive patchy and confluent T2/FLAIR hyperintensity seen throughout the periventricular and deep white matter both cerebral hemispheres. Extensive patchy involvement of the deep gray nuclei, brainstem, and cerebellum. Findings most likely related to advanced chronic microvascular ischemic disease. Multiple superimposed remote lacunar infarcts present about the bilateral basal ganglia/thalami. 15 mm focus of diffusion abnormality seen involving the posterior limb of the left internal capsule, consistent with an acute ischemic infarct (series 5, image 26). Additional 5 mm acute ischemic infarct noted involving the adjacent subinsular white matter (series 5, image 23). No associated hemorrhage or mass effect. No other evidence for acute or subacute  ischemia. Gray-white matter differentiation otherwise maintained. No acute intracranial hemorrhage. Innumerable scattered chronic micro hemorrhages seen throughout the brain, preponderance of which are clustered about the cerebellum and thalami, likely related to poorly controlled hypertension. No mass lesion, midline shift or mass effect. No hydrocephalus or extra-axial fluid collection. Pituitary gland suprasellar region within normal limits. Midline structures intact. Vascular: Major intracranial vascular flow voids are maintained. Skull and upper cervical spine: Craniocervical junction within normal limits. Bone marrow signal intensity normal. No scalp soft tissue abnormality. Sinuses/Orbits: Globes and orbital soft tissues within normal limits. Paranasal sinuses are largely clear. No significant mastoid effusion. Inner ear structures grossly normal. Other: None. IMPRESSION: 1. 15 mm acute ischemic nonhemorrhagic infarct involving the posterior  limb of the left internal capsule, with an additional 5 mm acute ischemic nonhemorrhagic infarct involving the adjacent left subinsular white matter. 2. Innumerable scattered chronic micro hemorrhages throughout the brain, preponderance of which are clustered about the cerebellum and thalami, likely related to poorly controlled hypertension. 3. Underlying age-related cerebral atrophy with advanced chronic microvascular ischemic disease. Electronically Signed   By: Jeannine Boga M.D.   On: 03/04/2020 00:22    ____________________________________________   PROCEDURES  Procedure(s) performed (including Critical Care): Critical care time 15 minutes.  This includes evaluating the patient researching treatment options for her hypertensive emergency and up-to-date talking to the intensivist and arranging a teleneurology consult which I do not believe is happened yet.  Patient is not a candidate for TPA as I do not know the time of the initial  insult.  Procedures   ____________________________________________   INITIAL IMPRESSION / ASSESSMENT AND PLAN / ED COURSE ----------------------------------------- 11:15 PM on 03/03/2020 -----------------------------------------   Patient's exam is consistent with a right-sided stroke.  Uncertain of when it happened.  CT does not show any acute strokes.  We will get the MRI.  Patient's blood pressure is extremely high so we started her on some Cardene and she is in the MRI now with an MRI compatible pump and blood pressure cuff and nurse. Teleneurology has been asked to see her as well.  Patient's troponin is elevated but has not changed.  Once we get the MRI back I anticipate admitting this patient.    ----------------------------------------- 12:41 AM on 03/04/2020 -----------------------------------------  MRI shows 2 acute infarcts and microhemorrhages.  I have not treated the patient with anything but the Cardene to decrease her blood pressure somewhat.  It has gone down now to 206.  We are tapering back on the Cardene so as not to cause any relative hypotension.  I did not actually want to go lower than 220 but it has taken a couple hours to get down this low.  We will not let it get any lower.  Intensivist is aware.  Because of the microhemorrhages I will not use any blood thinners.  Teleneurology consult has been ordered and is pending.  Patient is awake and alert feels better.  No changes in her mentation or exam at this time.         ____________________________________________   FINAL CLINICAL IMPRESSION(S) / ED DIAGNOSES  Final diagnoses:  Hypertensive emergency  Cerebrovascular accident (CVA), unspecified mechanism [facility])     ED Discharge Orders    None       Note:  This document was prepared using Dragon voice recognition software and may include unintentional dictation errors.    Nena Polio, MD 03/04/20 [phone]

## 2020-03-03 NOTE — ED Triage Notes (Addendum)
Pt from home via [place] with brother. Brother st pt lives alone, brother has been trying to get in touch with her for about a week. Brother traveled from [place] to see pt today, family st pt confused, not walking "normal"  with slurred speech. BI equal grip strength.   Pt st falling walking down the hallway yesterday. St multiple falls within the last few weeks due to losing her "balance". Pt A/Ox2.  NAd noted at this time.

## 2020-03-03 NOTE — ED Notes (Signed)
Pt on the phone getting screened for MRI

## 2020-03-03 NOTE — ED Triage Notes (Signed)
First nurse note- here for possible stroke. Sx for up to 1 week per family, but unsure because pt has been confused per family.  Ambulatory without difficulty.

## 2020-03-04 ENCOUNTER — Inpatient Hospital Stay
Admit: 2020-03-04 | Discharge: 2020-03-04 | Disposition: A | Discharge status: Home / Self Care | Payer: Medicare Other | Attending: Critical Care Medicine | Admitting: Critical Care Medicine

## 2020-03-04 ENCOUNTER — Inpatient Hospital Stay: Payer: Medicare Other

## 2020-03-04 DIAGNOSIS — Z85828 Personal history of other malignant neoplasm of skin: Secondary | ICD-10-CM | POA: Diagnosis not present

## 2020-03-04 DIAGNOSIS — E782 Mixed hyperlipidemia: Secondary | ICD-10-CM | POA: Diagnosis not present

## 2020-03-04 DIAGNOSIS — I639 Cerebral infarction, unspecified: Secondary | ICD-10-CM | POA: Diagnosis present

## 2020-03-04 DIAGNOSIS — I161 Hypertensive emergency: Secondary | ICD-10-CM | POA: Diagnosis present

## 2020-03-04 DIAGNOSIS — I6389 Other cerebral infarction: Secondary | ICD-10-CM | POA: Diagnosis not present

## 2020-03-04 DIAGNOSIS — N179 Acute kidney failure, unspecified: Secondary | ICD-10-CM | POA: Diagnosis present

## 2020-03-04 DIAGNOSIS — Z79899 Other long term (current) drug therapy: Secondary | ICD-10-CM | POA: Diagnosis not present

## 2020-03-04 DIAGNOSIS — Z87891 Personal history of nicotine dependence: Secondary | ICD-10-CM | POA: Diagnosis not present

## 2020-03-04 DIAGNOSIS — G8191 Hemiplegia, unspecified affecting right dominant side: Secondary | ICD-10-CM | POA: Diagnosis present

## 2020-03-04 DIAGNOSIS — E876 Hypokalemia: Secondary | ICD-10-CM | POA: Diagnosis present

## 2020-03-04 DIAGNOSIS — I10 Essential (primary) hypertension: Secondary | ICD-10-CM | POA: Diagnosis present

## 2020-03-04 DIAGNOSIS — Z933 Colostomy status: Secondary | ICD-10-CM | POA: Diagnosis not present

## 2020-03-04 DIAGNOSIS — Z66 Do not resuscitate: Secondary | ICD-10-CM | POA: Diagnosis present

## 2020-03-04 DIAGNOSIS — E871 Hypo-osmolality and hyponatremia: Secondary | ICD-10-CM | POA: Diagnosis present

## 2020-03-04 DIAGNOSIS — E785 Hyperlipidemia, unspecified: Secondary | ICD-10-CM | POA: Diagnosis present

## 2020-03-04 DIAGNOSIS — R4701 Aphasia: Secondary | ICD-10-CM | POA: Diagnosis present

## 2020-03-04 DIAGNOSIS — I739 Peripheral vascular disease, unspecified: Secondary | ICD-10-CM | POA: Diagnosis present

## 2020-03-04 DIAGNOSIS — Z85048 Personal history of other malignant neoplasm of rectum, rectosigmoid junction, and anus: Secondary | ICD-10-CM | POA: Diagnosis not present

## 2020-03-04 DIAGNOSIS — Z9049 Acquired absence of other specified parts of digestive tract: Secondary | ICD-10-CM | POA: Diagnosis not present

## 2020-03-04 DIAGNOSIS — E538 Deficiency of other specified B group vitamins: Secondary | ICD-10-CM | POA: Diagnosis present

## 2020-03-04 DIAGNOSIS — Z20822 Contact with and (suspected) exposure to covid-19: Secondary | ICD-10-CM | POA: Diagnosis present

## 2020-03-04 DIAGNOSIS — Z515 Encounter for palliative care: Secondary | ICD-10-CM | POA: Diagnosis not present

## 2020-03-04 DIAGNOSIS — Z7189 Other specified counseling: Secondary | ICD-10-CM | POA: Diagnosis not present

## 2020-03-04 DIAGNOSIS — Z90710 Acquired absence of both cervix and uterus: Secondary | ICD-10-CM | POA: Diagnosis not present

## 2020-03-04 DIAGNOSIS — R29810 Facial weakness: Secondary | ICD-10-CM | POA: Diagnosis present

## 2020-03-04 DIAGNOSIS — R29703 NIHSS score 3: Secondary | ICD-10-CM | POA: Diagnosis present

## 2020-03-04 LAB — GLUCOSE, CAPILLARY
Glucose-Capillary: 96 mg/dL (ref 70–99)
Glucose-Capillary: 94 mg/dL (ref 70–99)
Glucose-Capillary: 94 mg/dL (ref 70–99)
Glucose-Capillary: 81 mg/dL (ref 70–99)

## 2020-03-04 LAB — BASIC METABOLIC PANEL
Anion gap: 14 (ref 5–15)
BUN: 18 mg/dL (ref 8–23)
CO2: 23 mmol/L (ref 22–32)
Calcium: 9.1 mg/dL (ref 8.9–10.3)
Chloride: 101 mmol/L (ref 98–111)
Creatinine, Ser: 1.11 mg/dL — ABNORMAL HIGH (ref 0.44–1.00)
GFR calc Af Amer: 60 mL/min — ABNORMAL LOW (ref >60)
GFR calc non Af Amer: 52 mL/min — ABNORMAL LOW (ref >60)
Glucose, Bld: 90 mg/dL (ref 70–99)
Potassium: 3.9 mmol/L (ref 3.5–5.1)
Sodium: 138 mmol/L (ref 135–145)

## 2020-03-04 LAB — CBC WITH DIFFERENTIAL/PLATELET
Abs Immature Granulocytes: 0.03 10*3/uL (ref 0.00–0.07)
Basophils Absolute: 0 10*3/uL (ref 0.0–0.1)
Basophils Relative: 1 %
Eosinophils Absolute: 0.1 10*3/uL (ref 0.0–0.5)
Eosinophils Relative: 1 %
HCT: 41.4 % (ref 36.0–46.0)
Hemoglobin: 13.7 g/dL (ref 12.0–15.0)
Immature Granulocytes: 1 %
Lymphocytes Relative: 11 %
Lymphs Abs: 0.6 10*3/uL — ABNORMAL LOW (ref 0.7–4.0)
MCH: 28.2 pg (ref 26.0–34.0)
MCHC: 33.1 g/dL (ref 30.0–36.0)
MCV: 85.4 fL (ref 80.0–100.0)
Monocytes Absolute: 0.6 10*3/uL (ref 0.1–1.0)
Monocytes Relative: 11 %
Neutro Abs: 4.5 10*3/uL (ref 1.7–7.7)
Neutrophils Relative %: 75 %
Platelets: 307 10*3/uL (ref 150–400)
RBC: 4.85 MIL/uL (ref 3.87–5.11)
RDW: 14 % (ref 11.5–15.5)
WBC: 5.9 10*3/uL (ref 4.0–10.5)
nRBC: 0 % (ref 0.0–0.2)

## 2020-03-04 LAB — SARS CORONAVIRUS 2 BY RT PCR (HOSPITAL ORDER, PERFORMED IN ~~LOC~~ HOSPITAL LAB): SARS Coronavirus 2: NEGATIVE

## 2020-03-04 LAB — TROPONIN I (HIGH SENSITIVITY): Troponin I (High Sensitivity): 52 ng/L — ABNORMAL HIGH (ref <18)

## 2020-03-04 LAB — LIPID PANEL
Cholesterol: 213 mg/dL — ABNORMAL HIGH (ref 0–200)
HDL: 124 mg/dL (ref >40)
LDL Cholesterol: 78 mg/dL (ref 0–99)
Total CHOL/HDL Ratio: 1.7 RATIO
Triglycerides: 54 mg/dL (ref <150)
VLDL: 11 mg/dL (ref 0–40)

## 2020-03-04 LAB — MRSA PCR SCREENING: MRSA by PCR: NEGATIVE

## 2020-03-04 LAB — HIV ANTIBODY (ROUTINE TESTING W REFLEX): HIV Screen 4th Generation wRfx: NONREACTIVE

## 2020-03-04 LAB — ECHOCARDIOGRAM COMPLETE
Height: 67 in
Weight: 2292.78 oz

## 2020-03-04 MED ORDER — POLYETHYLENE GLYCOL 3350 17 G PO PACK: 17.0000 g | PACK | Freq: Every day | ORAL | Status: DC | PRN

## 2020-03-04 MED ORDER — MIDAZOLAM HCL 2 MG/2ML IJ SOLN
2.0000 mg | Freq: Once | INTRAMUSCULAR | Status: AC
  Administered 2020-03-04: 2 mg via INTRAVENOUS
  Filled 2020-03-04: qty 2

## 2020-03-04 MED ORDER — HYDRALAZINE HCL 20 MG/ML IJ SOLN
10.0000 mg | INTRAMUSCULAR | Status: DC | PRN
  Administered 2020-03-04 – 2020-03-05 (×2): 10 mg via INTRAVENOUS
  Administered 2020-03-05: 20 mg via INTRAVENOUS
  Administered 2020-03-05: 10 mg via INTRAVENOUS
  Administered 2020-03-06 – 2020-03-08 (×5): 20 mg via INTRAVENOUS
  Filled 2020-03-04 (×11): qty 1

## 2020-03-04 MED ORDER — ONDANSETRON HCL 4 MG/2ML IJ SOLN
4.0000 mg | Freq: Once | INTRAMUSCULAR | Status: AC
  Administered 2020-03-04: 4 mg via INTRAVENOUS
  Filled 2020-03-04: qty 2

## 2020-03-04 MED ORDER — CHLORHEXIDINE GLUCONATE CLOTH 2 % EX PADS
6.0000 | MEDICATED_PAD | Freq: Every day | CUTANEOUS | Status: DC
  Administered 2020-03-04 – 2020-03-07 (×5): 6 via TOPICAL

## 2020-03-04 MED ORDER — POTASSIUM CHLORIDE 10 MEQ/100ML IV SOLN
10.0000 meq | INTRAVENOUS | Status: AC
  Administered 2020-03-04 (×4): 10 meq via INTRAVENOUS
  Filled 2020-03-04 (×3): qty 100

## 2020-03-04 MED ORDER — DOCUSATE SODIUM 100 MG PO CAPS: 100.0000 mg | ORAL_CAPSULE | Freq: Two times a day (BID) | ORAL | Status: DC | PRN

## 2020-03-04 MED ORDER — CLONIDINE HCL 0.1 MG PO TABS
0.2000 mg | ORAL_TABLET | Freq: Three times a day (TID) | ORAL | Status: DC
  Administered 2020-03-04 (×2): 0.2 mg via ORAL
  Filled 2020-03-04 (×2): qty 2

## 2020-03-04 MED ORDER — FENTANYL CITRATE (PF) 100 MCG/2ML IJ SOLN
25.0000 ug | Freq: Once | INTRAMUSCULAR | Status: AC
  Administered 2020-03-04: 25 ug via INTRAVENOUS
  Filled 2020-03-04: qty 2

## 2020-03-04 MED ORDER — ONDANSETRON HCL 4 MG/2ML IJ SOLN: 4.0000 mg | Freq: Four times a day (QID) | INTRAMUSCULAR | Status: DC | PRN

## 2020-03-04 NOTE — Progress Notes (Signed)
Pharmacy Electrolyte Monitoring Consult:  Pharmacy consulted to assist in monitoring and replacing electrolytes in this 69 y.o. female admitted on 03/03/2020 with Altered Mental Status and Weakness  Patient with MRI showing acute left internal capsular and subinsular infarcts as well as chronic microhemorrhages. Further decline in neurological status 7/1 am with CTH negative for hemorrhagic transformation but with edema in the area of the left internal capsular infarct. Patient with significantly elevated BP on arrival, has since improved and now off nicardipine drip.  Labs:  Sodium (mmol/L)  Date Value  03/04/2020 138  12/13/2011 129 (L)   Potassium (mmol/L)  Date Value  03/04/2020 3.9  12/13/2011 3.5   Magnesium (mg/dL)  Date Value  12/13/2011 2.2   Calcium (mg/dL)  Date Value  03/04/2020 9.1   Calcium, Total (mg/dL)  Date Value  12/13/2011 8.9   Albumin (g/dL)  Date Value  03/03/2020 4.2  12/11/2011 2.4 (L)    Assessment/Plan: Patient received potassium replacement early this morning. Current electrolytes WNL. Will order all electrolytes with morning labs and continue to follow.  Dorena Bodo, PharmD Clinical Pharmacist 03/04/2020 2:38 PM

## 2020-03-04 NOTE — ED Notes (Signed)
Provider made aware of BP.

## 2020-03-04 NOTE — H&P (Signed)
Name: Madeline Park MRN: [id number] DOB: [birth year]    ADMISSION DATE:  03/03/2020 CONSULTATION DATE: 03/04/2020  REFERRING MD : Dr. Cinda Quest   CHIEF COMPLAINT: Confusion   BRIEF PATIENT DESCRIPTION:  69 yo female admitted with hypertensive emergency and acute CVA requiring nicardipine gtt   SIGNIFICANT EVENTS/STUDIES:  06/30: CT Head negative for acute intracranial hemorrhage or intracranial mass Lesion. Age indeterminate hypodensity/possible infarcts within the thalamus, basal ganglia and white matter. Advanced chronic small vessel ischemic change of the white matter. MRI could be considered for further evaluation given extensive white matter hypodensity and lack of prior studies for comparison to determine chronicity. 06/30: MR Brain revealed 15 mm acute ischemic nonhemorrhagic infarct involving the posterior limb of the left internal capsule, with an additional 5 mm acute ischemic nonhemorrhagic infarct involving the adjacent left subinsular white matter. Innumerable scattered chronic micro hemorrhages throughout the brain, preponderance of which are clustered about the cerebellum and thalami, likely related to poorly controlled hypertension. Underlying age-related cerebral atrophy with advanced chronic microvascular ischemic disease. 07/1: Pt admitted to ICU with hypertensive emergency and acute CVA requiring nicardipine gtt   HISTORY OF PRESENT ILLNESS:  This is a 69 yo female with a PMH of Skin Cancer, Peritonitis, HTN, Metastatic Rectal Cancer to the Liver (dx 11/2002 s/p Hartmann and LAR with Ileostomy), and B12 Deficiency.  She presented to [facility] ER on 06/30 after the brother reported she was "not acting like herself," had slurred speech, and frequent falls. Per ER notes pts brother reported he had unsuccessfully tried to contact the pt over the past week, therefore he went to pts home and noticed the pt was confused.  Upon arrival to the ER pt had continued slurred speech, anomic  aphasia, right-sided weakness, and rapid alternating movement on the right side. Pt extremely hypertensive bp 250s/160's, therefore nicardipine gtt initiated.  CT Head negative for acute intracranial hemorrhage or intracranial mass lesion, however revealed age indeterminate hypodensity/possible infarcts within the thalamus/basal ganglia/white matter.  MR Brain revealed 15 mm acute ischemic nonhemorrhagic infarct involving the posterior limb of the left internal capsule, with an additional 5 mm acute ischemic nonhemorrhagic infarct involving the adjacent left subinsular white matter.  PCCM team contacted for ICU admission.    PAST MEDICAL HISTORY :   has a past medical history of B12 deficiency, Cancer ([place]), Fistula, Hypertension, Nausea, Peritonitis ([place]), and Skin cancer.  has a past surgical history that includes Colostomy; Abdominal hysterectomy; Ileostomy; Appendectomy; and Eenterocutaneous fistula closure. Prior to Admission medications   Medication Sig Start Date End Date Taking? Authorizing Provider  cetirizine (ZYRTEC) 10 MG tablet Take by mouth.   Yes [provider]  cloNIDine (CATAPRES) 0.2 MG tablet Take 1 tablet (0.2 mg total) by mouth 3 (three) times daily. 02/20/20  Yes Tonia Ghent, MD  cyanocobalamin (,VITAMIN B-12,) 1000 MCG/ML injection INJECT 1ML INTO THE MUSCLE TWICE A MONTH. 02/17/20  Yes Tonia Ghent, MD  diphenhydrAMINE (BENADRYL) 25 mg capsule Take by mouth.   Yes [provider]  Multiple Vitamins-Minerals (MULTIVITAMIN WITH MINERALS) tablet Take by mouth.   Yes [provider]  ondansetron (ZOFRAN) 4 MG tablet TAKE 1 TABLET BY MOUTH EVERY 8 HOURS AS NEEDED FOR NAUSEA 01/28/20  Yes Tonia Ghent, MD  Psyllium (METAMUCIL PO) Take 5 capsules by mouth daily as needed.   Yes [provider]  Vitamin D, Ergocalciferol, (DRISDOL) 1.25 MG (50000 UNIT) CAPS capsule Take 1 capsule (50,000 Units total) by mouth every 7 (seven)  days. 02/20/20   Yes Tonia Ghent, MD  cholestyramine Lucrezia Starch) 4 GM/DOSE powder TAKE ONE SCOOPFUL BY MOUTH 3 (THREE) TIMES DAILY AS NEEDED (WITH A MEAL). 12/17/19   Tonia Ghent, MD   Allergies  Allergen Reactions  . Ivp Dye [Iodinated Diagnostic Agents] Rash    Rash and itching  . Promethazine Hcl     Muscle spasms  . Imodium [Loperamide]     Rash  . Metoprolol Other (See Comments)    GI upset, not a true allergic reaction.   . Molasses     Itching, rash  . Propofol [Propofol]     vomiting  . Lisinopril Rash    FAMILY HISTORY:  family history includes Dementia in her mother; Heart disease in her father; Multiple myeloma in her father; Stroke in her father. SOCIAL HISTORY:  reports that she quit smoking about 15 years ago. Her smoking use included cigarettes. She has a 45.00 pack-year smoking history. She has never used smokeless tobacco. She reports current alcohol use. She reports that she does not use drugs.  REVIEW OF SYSTEMS: Positives in BOLD  Constitutional: Negative for fever, chills, weight loss, malaise/fatigue and diaphoresis.  HENT: Negative for hearing loss, ear pain, nosebleeds, congestion, sore throat, neck pain, tinnitus and ear discharge.   Eyes: Negative for blurred vision, double vision, photophobia, pain, discharge and redness.  Respiratory: Negative for cough, hemoptysis, sputum production, shortness of breath, wheezing and stridor.   Cardiovascular: Negative for chest pain, palpitations, orthopnea, claudication, leg swelling and PND.  Gastrointestinal: Negative for heartburn, nausea, vomiting, abdominal pain, diarrhea, constipation, blood in stool and melena.  Genitourinary: Negative for dysuria, urgency, frequency, hematuria and flank pain.  Musculoskeletal:myalgias, back pain, joint pain and falls.  Skin: Negative for itching and rash.  Neurological: confusion, dizziness, tingling, tremors, sensory change, speech change, focal weakness, seizures, loss of  consciousness, right-sided weakness and headaches.  Endo/Heme/Allergies: Negative for environmental allergies and polydipsia. Does not bruise/bleed easily.  SUBJECTIVE:   VITAL SIGNS: Temp:  [97.9 F (36.6 C)] 97.9 F (36.6 C) (06/30 1901) Pulse Rate:  [77-97] 86 (07/01 0200) Resp:  [17-23] 20 (07/01 0200) BP: (109-257)/(62-173) 109/62 (07/01 0150) SpO2:  [96 %-100 %] 100 % (07/01 0200) Weight:  [68 kg] 68 kg (06/30 1845)  PHYSICAL EXAMINATION: General: acutely ill appearing female, NAD resting in bed  Neuro: intermittently confused, follows commands, RUE motor strength 3/5, LUE and BLE motor strength 4/5, anomic aphasia, PERRLA HEENT: supple, no JVD  Cardiovascular: nsr, rrr, no R/G  Lungs: clear throughout, even, non labored  Abdomen: +BS x4, obese, soft, non tender, non distended  Musculoskeletal: normal bulk and tone, no edema  Skin: intact no rashes or lesions present   Recent Labs  Lab 03/03/20 1855  NA 136  K 3.3*  CL 101  CO2 26  BUN 24*  CREATININE 1.43*  GLUCOSE 97   Recent Labs  Lab 03/03/20 1855  HGB 13.5  HCT 40.5  WBC 6.7  PLT 306   CT HEAD WO CONTRAST  Result Date: 03/03/2020 CLINICAL DATA:  Confusion EXAM: CT HEAD WITHOUT CONTRAST TECHNIQUE: Contiguous axial images were obtained from the base of the skull through the vertex without intravenous contrast. COMPARISON:  None. FINDINGS: Brain: No hemorrhage or intracranial mass is visualized. Extensive white matter hypodensity. Age indeterminate hypodensity within the left greater than right thalamus and basal ganglia. Mild ventricular enlargement. Vascular: No hyperdense vessels.  Carotid vascular calcification Skull: Normal. Negative for fracture or focal lesion. Sinuses/Orbits: No  acute finding. Other: None IMPRESSION: 1. Negative for acute intracranial hemorrhage or intracranial mass lesion. 2. Age indeterminate hypodensity/possible infarcts within the thalamus, basal ganglia and white matter. Advanced  chronic small vessel ischemic change of the white matter. MRI could be considered for further evaluation given extensive white matter hypodensity and lack of prior studies for comparison to determine chronicity. Electronically Signed   By: Donavan Foil M.D.   On: 03/03/2020 19:38   MR BRAIN WO CONTRAST  Result Date: 03/04/2020 CLINICAL DATA:  Initial evaluation for acute ataxia, stroke suspected. EXAM: MRI HEAD WITHOUT CONTRAST TECHNIQUE: Multiplanar, multiecho pulse sequences of the brain and surrounding structures were obtained without intravenous contrast. COMPARISON:  Prior CT from earlier the same day. FINDINGS: Brain: Examination moderately degraded by motion artifact. Generalized age-related cerebral atrophy. Extensive patchy and confluent T2/FLAIR hyperintensity seen throughout the periventricular and deep white matter both cerebral hemispheres. Extensive patchy involvement of the deep gray nuclei, brainstem, and cerebellum. Findings most likely related to advanced chronic microvascular ischemic disease. Multiple superimposed remote lacunar infarcts present about the bilateral basal ganglia/thalami. 15 mm focus of diffusion abnormality seen involving the posterior limb of the left internal capsule, consistent with an acute ischemic infarct (series 5, image 26). Additional 5 mm acute ischemic infarct noted involving the adjacent subinsular white matter (series 5, image 23). No associated hemorrhage or mass effect. No other evidence for acute or subacute ischemia. Gray-white matter differentiation otherwise maintained. No acute intracranial hemorrhage. Innumerable scattered chronic micro hemorrhages seen throughout the brain, preponderance of which are clustered about the cerebellum and thalami, likely related to poorly controlled hypertension. No mass lesion, midline shift or mass effect. No hydrocephalus or extra-axial fluid collection. Pituitary gland suprasellar region within normal limits. Midline  structures intact. Vascular: Major intracranial vascular flow voids are maintained. Skull and upper cervical spine: Craniocervical junction within normal limits. Bone marrow signal intensity normal. No scalp soft tissue abnormality. Sinuses/Orbits: Globes and orbital soft tissues within normal limits. Paranasal sinuses are largely clear. No significant mastoid effusion. Inner ear structures grossly normal. Other: None. IMPRESSION: 1. 15 mm acute ischemic nonhemorrhagic infarct involving the posterior limb of the left internal capsule, with an additional 5 mm acute ischemic nonhemorrhagic infarct involving the adjacent left subinsular white matter. 2. Innumerable scattered chronic micro hemorrhages throughout the brain, preponderance of which are clustered about the cerebellum and thalami, likely related to poorly controlled hypertension. 3. Underlying age-related cerebral atrophy with advanced chronic microvascular ischemic disease. Electronically Signed   By: Jeannine Boga M.D.   On: 03/04/2020 00:22    ASSESSMENT / PLAN:  Acute CVA Hypertensive Emergency-improving   Acute renal failure with hypokalemia  Continuous telemetry monitoring  Lipid panel, hemoglobin A1c, and thyroid panel pending  Supplemental O2 for dyspnea and/or hypoxia  Will allow for permissive hypertension, prn hydralazine for sbp >190 and/or dbp >110 Echo and US Carotid Bilateral pending  PT/OT/Speech consulted appreciate input Keep NPO pending speech evaluation  Neurology consulted appreciate input  Trend BMP  Replace electrolytes as indicated  Monitor UOP  Avoid nephrotoxic medications  Fall precautions    Marda Stalker, [place] Pager [phone] (please enter 7 digits) PCCM Consult Pager [phone] (please enter 7 digits)

## 2020-03-04 NOTE — ED Notes (Signed)
Attempted to call report

## 2020-03-04 NOTE — Progress Notes (Addendum)
SLP Cancellation Note  Patient Details Name: ANDERSEN MCKIVER MRN: [id number] DOB: April 22, 1951   Cancelled treatment:       Reason Eval/Treat Not Completed: Patient's level of consciousness;Patient not medically ready (chart reviewed; consulted NSG re: pt's status). Per NSG report, pt had a decline in status w/ acute Neuro change this morning during breakfast meal. Repeat Head CT revealed "Acute infarct posterior limb internal capsule left shows progressive low-density edema compared with CT yesterday; Atrophy. Advanced chronic small vessel ischemic changes. No acute hemorrhage.". Pt also had to have calming medications for test and is now drowsy/lethargic.  Recommend holding on POs and BSE today; ST services will f/u tomorrow w/ pt's status and appropriateness for BSE. Recommend frequent oral care for hygiene and stimulation of swallowing. NSG agreed stating pt was not appropriate right now.     Orinda Kenner, [place], CCC-SLP Birl Lobello 03/04/2020, 12:57 PM

## 2020-03-04 NOTE — Consult Note (Signed)
Requesting Physician: Mortimer Fries    Chief Complaint: AMS and weakness  I have been asked by Dr. Mortimer Fries to see this patient in consultation for acute infarct.  HPI: Madeline Park is an 69 y.o. female who is poorly responsive and unable to provide any history therefore all history obtained from the chart.  Patient with a PMH of Skin Cancer, Peritonitis, HTN, Metastatic Rectal Cancer to the Liver (dx 11/2002 s/p Jeanette Caprice and LAR with Ileostomy), and B12 Deficiency who presented to [facility] ER on 06/30 after the brother reported she was "not acting like herself," had slurred speech, and frequent falls. Per ER notes brother reported he had unsuccessfully tried to contact the patient over the past week, therefore he went to her home and noticed that she was confused.  Upon arrival to the ER patient had continued slurred speech, anomic aphasia and right-sided weakness.  Patient was extremely hypertensive with BP 250s/160's, therefore nicardipine gtt initiated. Initial NIHSS of 3.    Date last known well: Unable to determine Time last known well: Unable to determine tPA Given: No: Unable to determine LKW  Past Medical History:  Diagnosis Date  . B12 deficiency   . Cancer [facility])    found after colon perforation in 2003, Rectal, Stage IV; S/P surgery, chemo.  no recurrence since chemo concluded 04,  followed at [facility]  . Fistula   . Hypertension   . Nausea    responds to reglan and zofran together  . Peritonitis ([place])    after colon perforation 2003  . [facility]    [facility]    Past Surgical History:  Procedure Laterality Date  . ABDOMINAL HYSTERECTOMY    . APPENDECTOMY    . COLOSTOMY     intially 2003, takedown 2006  . ENTEROCUTANEOUS FISTULA CLOSURE    . ILEOSTOMY     take down 2013    Family History  Problem Relation Age of Onset  . Dementia Mother   . Stroke Father   . Multiple myeloma Father   . Heart disease Father   . Colon cancer Neg Hx   . Breast cancer Neg Hx    Social  History:  reports that she quit smoking about 15 years ago. Her smoking use included cigarettes. She has a 45.00 pack-year smoking history. She has never used smokeless tobacco. She reports current alcohol use. She reports that she does not use drugs.  Allergies:  Allergies  Allergen Reactions  . Ivp Dye [Iodinated Diagnostic Agents] Rash    Rash and itching  . Promethazine Hcl     Muscle spasms  . Imodium [Loperamide]     Rash  . Metoprolol Other (See Comments)    GI upset, not a true allergic reaction.   . Molasses     Itching, rash  . Propofol [Propofol]     vomiting  . Lisinopril Rash    Medications:  I have reviewed the patient's current medications. Prior to Admission:  Medications Prior to Admission  Medication Sig Dispense Refill Last Dose  . cetirizine (ZYRTEC) 10 MG tablet Take by mouth.   Past Month at PRN  . cloNIDine (CATAPRES) 0.2 MG tablet Take 1 tablet (0.2 mg total) by mouth 3 (three) times daily. 270 tablet 3 Past Month at Unknown time  . cyanocobalamin (,VITAMIN B-12,) 1000 MCG/ML injection INJECT 1ML INTO THE MUSCLE TWICE A MONTH. 5 mL 12 Past Month at Unknown   . diphenhydrAMINE (BENADRYL) 25 mg capsule Take by mouth.  Past Month at PRN  . Multiple Vitamins-Minerals (MULTIVITAMIN WITH MINERALS) tablet Take by mouth.   Past Month at Unknown time  . ondansetron (ZOFRAN) 4 MG tablet TAKE 1 TABLET BY MOUTH EVERY 8 HOURS AS NEEDED FOR NAUSEA 40 tablet 12 Past Month at PRN  . Psyllium (METAMUCIL PO) Take 5 capsules by mouth daily as needed.   Past Month at PRN  . Vitamin D, Ergocalciferol, (DRISDOL) 1.25 MG (50000 UNIT) CAPS capsule Take 1 capsule (50,000 Units total) by mouth every 7 (seven) days. 13 capsule 0 Past Month at Unknown time  . cholestyramine (QUESTRAN) 4 GM/DOSE powder TAKE ONE SCOOPFUL BY MOUTH 3 (THREE) TIMES DAILY AS NEEDED (WITH A MEAL). 378 g 1    Scheduled: . Chlorhexidine Gluconate Cloth  6 each Topical Daily  . cloNIDine  0.2 mg Oral TID     ROS: Unable to obtain due to mental status  Physical Examination: Blood pressure (!) 177/94, pulse 94, temperature 97.9 F (36.6 C), temperature source Oral, resp. rate (!) 25, height _0  (1.702 m), weight 65 kg, SpO2 98 %.  HEENT-  Normocephalic, no lesions, without obvious abnormality.  Normal external eye and conjunctiva.  Normal TM's bilaterally.  Normal auditory canals and external ears. Normal external nose, mucus membranes and septum.  Normal pharynx. Cardiovascular- S1, S2 normal, pulses palpable throughout   Lungs- chest clear, no wheezing, rales, normal symmetric air entry Abdomen- soft, non-tender; bowel sounds normal; no masses,  no organomegaly Extremities- no edema Lymph-no adenopathy palpable Musculoskeletal-no joint tenderness, deformity or swelling Skin-warm and dry, no hyperpigmentation, vitiligo, or suspicious lesions  Neurological Examination   Mental Status: Lethargic.  Eyes open and awake but not verbally responsive other than one or two words.  Follows some simple commands.   Cranial Nerves: II: Does not blink to bilateral confrontation, pupils equal, round, reactive to light and accommodation III,IV, VI: ptosis not present, extra-ocular motions intact bilaterally V,VII: right facial droop VIII: hearing normal bilaterally IX,X: gag reflex present XI: unable to test XII: unable to test Motor: RUE falls back to the bed when lifted.  Moves LUE above head spontaneously and is able to maintain.  Moves LLE more than RLE Sensory: Does not respond to noxious stimuli on the RUE with decreased response on the RLE.   Deep Tendon Reflexes: Symmetric throughout Plantars: Right: mute   Left: mute Cerebellar: Unable to perform due to lethargy.  Leans to the right Gait: not tested due to safety concerns    Laboratory Studies:  Basic Metabolic Panel: Recent Labs  Lab 03/03/20 1855 03/04/20 0528  NA 136 138  K 3.3* 3.9  CL 101 101  CO2 26 23  GLUCOSE 97 90   BUN 24* 18  CREATININE 1.43* 1.11*  CALCIUM 9.3 9.1    Liver Function Tests: Recent Labs  Lab 03/03/20 1855  AST 20  ALT 16  ALKPHOS 59  BILITOT 1.4*  PROT 7.1  ALBUMIN 4.2   No results for input(s): LIPASE, AMYLASE in the last 168 hours. No results for input(s): AMMONIA in the last 168 hours.  CBC: Recent Labs  Lab 03/03/20 1855 03/04/20 0520  WBC 6.7 5.9  NEUTROABS 4.6 4.5  HGB 13.5 13.7  HCT 40.5 41.4  MCV 85.4 85.4  PLT 306 307    Cardiac Enzymes: No results for input(s): CKTOTAL, CKMB, CKMBINDEX, TROPONINI in the last 168 hours.  BNP: Invalid input(s): POCBNP  CBG: Recent Labs  Lab 03/04/20 0236 03/04/20 0746  GLUCAP 96 94  Microbiology: Results for orders placed or performed during the hospital encounter of 03/03/20  SARS Coronavirus 2 by RT PCR (hospital order, performed in [facility] hospital lab) Nasopharyngeal Nasopharyngeal Swab     Status: None   Collection Time: 03/04/20  2:20 AM   Specimen: Nasopharyngeal Swab  Result Value Ref Range Status   SARS Coronavirus 2 NEGATIVE NEGATIVE Final    Comment: (NOTE) SARS-CoV-2 target nucleic acids are NOT DETECTED.  The SARS-CoV-2 RNA is generally detectable in upper and lower respiratory specimens during the acute phase of infection. The lowest concentration of SARS-CoV-2 viral copies this assay can detect is 250 copies / mL. A negative result does not preclude SARS-CoV-2 infection and should not be used as the sole basis for treatment or other patient management decisions.  A negative result may occur with improper specimen collection / handling, submission of specimen other than nasopharyngeal swab, presence of viral mutation(s) within the areas targeted by this assay, and inadequate number of viral copies (<250 copies / mL). A negative result must be combined with clinical observations, patient history, and epidemiological information.  Fact Sheet for Patients:    StrictlyIdeas.no  Fact Sheet for Healthcare Providers: BankingDealers.co.za  This test is not yet approved or  cleared by the [place] FDA and has been authorized for detection and/or diagnosis of SARS-CoV-2 by FDA under an Emergency Use Authorization (EUA).  This EUA will remain in effect (meaning this test can be used) for the duration of the COVID-19 declaration under Section 564(b)(1) of the Act, 21 U.S.C. section 360bbb-3(b)(1), unless the authorization is terminated or revoked sooner.  Performed at [facility], [address] [place], [place] [address]   MRSA PCR Screening     Status: None   Collection Time: 03/04/20  2:39 AM   Specimen: Nasopharyngeal  Result Value Ref Range Status   MRSA by PCR NEGATIVE NEGATIVE Final    Comment:        The GeneXpert MRSA Assay (FDA approved for NASAL specimens only), is one component of a comprehensive MRSA colonization surveillance program. It is not intended to diagnose MRSA infection nor to guide or monitor treatment for MRSA infections. Performed at [facility], [address] [place], [place] [address]     Coagulation Studies: Recent Labs    03/03/20 1855  LABPROT 12.9  INR 1.0    Urinalysis:  Recent Labs  Lab 03/03/20 2003  COLORURINE STRAW*  LABSPEC 1.008  PHURINE 6.0  GLUCOSEU NEGATIVE  HGBUR NEGATIVE  BILIRUBINUR NEGATIVE  KETONESUR NEGATIVE  PROTEINUR NEGATIVE  NITRITE NEGATIVE  LEUKOCYTESUR NEGATIVE    Lipid Panel:    Component Value Date/Time   CHOL 213 (H) 03/04/2020 0520   TRIG 54 03/04/2020 0520   HDL 124 03/04/2020 0520   CHOLHDL 1.7 03/04/2020 0520   VLDL 11 03/04/2020 0520   LDLCALC 78 03/04/2020 0520    HgbA1C: No results found for: HGBA1C  Urine Drug Screen:  No results found for: LABOPIA, COCAINSCRNUR, LABBENZ, AMPHETMU, THCU, LABBARB  Alcohol Level: No results for input(s): ETH in the last 168  hours.  Other results: EKG: sinus rhythm at 74 bpm.  Imaging: CT HEAD WO CONTRAST  Result Date: 03/03/2020 CLINICAL DATA:  Confusion EXAM: CT HEAD WITHOUT CONTRAST TECHNIQUE: Contiguous axial images were obtained from the base of the skull through the vertex without intravenous contrast. COMPARISON:  None. FINDINGS: Brain: No hemorrhage or intracranial mass is visualized. Extensive white matter hypodensity. Age indeterminate hypodensity within the left  greater than right thalamus and basal ganglia. Mild ventricular enlargement. Vascular: No hyperdense vessels.  Carotid vascular calcification Skull: Normal. Negative for fracture or focal lesion. Sinuses/Orbits: No acute finding. Other: None IMPRESSION: 1. Negative for acute intracranial hemorrhage or intracranial mass lesion. 2. Age indeterminate hypodensity/possible infarcts within the thalamus, basal ganglia and white matter. Advanced chronic small vessel ischemic change of the white matter. MRI could be considered for further evaluation given extensive white matter hypodensity and lack of prior studies for comparison to determine chronicity. Electronically Signed   By: Donavan Foil M.D.   On: 03/03/2020 19:38   MR BRAIN WO CONTRAST  Result Date: 03/04/2020 CLINICAL DATA:  Initial evaluation for acute ataxia, stroke suspected. EXAM: MRI HEAD WITHOUT CONTRAST TECHNIQUE: Multiplanar, multiecho pulse sequences of the brain and surrounding structures were obtained without intravenous contrast. COMPARISON:  Prior CT from earlier the same day. FINDINGS: Brain: Examination moderately degraded by motion artifact. Generalized age-related cerebral atrophy. Extensive patchy and confluent T2/FLAIR hyperintensity seen throughout the periventricular and deep white matter both cerebral hemispheres. Extensive patchy involvement of the deep gray nuclei, brainstem, and cerebellum. Findings most likely related to advanced chronic microvascular ischemic disease. Multiple  superimposed remote lacunar infarcts present about the bilateral basal ganglia/thalami. 15 mm focus of diffusion abnormality seen involving the posterior limb of the left internal capsule, consistent with an acute ischemic infarct (series 5, image 26). Additional 5 mm acute ischemic infarct noted involving the adjacent subinsular white matter (series 5, image 23). No associated hemorrhage or mass effect. No other evidence for acute or subacute ischemia. Gray-white matter differentiation otherwise maintained. No acute intracranial hemorrhage. Innumerable scattered chronic micro hemorrhages seen throughout the brain, preponderance of which are clustered about the cerebellum and thalami, likely related to poorly controlled hypertension. No mass lesion, midline shift or mass effect. No hydrocephalus or extra-axial fluid collection. Pituitary gland suprasellar region within normal limits. Midline structures intact. Vascular: Major intracranial vascular flow voids are maintained. Skull and upper cervical spine: Craniocervical junction within normal limits. Bone marrow signal intensity normal. No scalp soft tissue abnormality. Sinuses/Orbits: Globes and orbital soft tissues within normal limits. Paranasal sinuses are largely clear. No significant mastoid effusion. Inner ear structures grossly normal. Other: None. IMPRESSION: 1. 15 mm acute ischemic nonhemorrhagic infarct involving the posterior limb of the left internal capsule, with an additional 5 mm acute ischemic nonhemorrhagic infarct involving the adjacent left subinsular white matter. 2. Innumerable scattered chronic micro hemorrhages throughout the brain, preponderance of which are clustered about the cerebellum and thalami, likely related to poorly controlled hypertension. 3. Underlying age-related cerebral atrophy with advanced chronic microvascular ischemic disease. Electronically Signed   By: Jeannine Boga M.D.   On: 03/04/2020 00:22    Assessment: 69  y.o. female with a PMH of Skin Cancer, Peritonitis, HTN, Metastatic Rectal Cancer to the Liver (dx 11/2002 s/p Jeanette Caprice and LAR with Ileostomy), and B12 Deficiency who presented to [facility] ER on 06/30 after the brother reported she was "not acting like herself," had slurred speech, and frequent falls. Per ER notes brother reported he had unsuccessfully tried to contact the patient over the past week, therefore he went to her home and noticed that she was confused.  Unclear date of onset of symptoms.  Initial NIHSS of 3.  Patient markedly hypertensive.  MRI of the brain personally reviewed and reveals acute left internal capsular and subinsular infarcts.  Extensive evidence of chronic microhemorrhages.  Etiology likely small vessel disease and secondary to poorly controlled hypertension.  Carotid dopplers pending.  Echocardiogram shows no cardiac source of emboli with an EF of 60-65% and LVH.  A1c pending, LDL 78. Patient admitted to the ICU and this morning with further neurological decline.  Stat head CT personally reviewed and shows no hemorrhagic transformation.  Edema is noted in area of left internal capsular infarct which may be contributing to her current worsening.   .   Stroke Risk Factors - hypertension  Plan: 1. Once able to take po, statin for lipid management with target LDL<70. 2. PT consult, OT consult, Speech consult 3. Echocardiogram 4. Carotid dopplers 5. Prophylactic therapy-Antiplatelet med: Aspirin - dose 324m per rectum daily 6. NPO until RN stroke swallow screen 7. Telemetry monitoring 8. Frequent neuro checks 9. Agree with BP control   LAlexis Goodell MD Neurology [phone]/09/2019, 9:01 AM

## 2020-03-04 NOTE — Progress Notes (Signed)
[facility] Progress Note Patient Name: Madeline Park DOB: [birth year] MRN: [id number]   Date of Service  03/04/2020  HPI/Events of Note  Patient admitted with altered mental status, brain imaging revealed an internal capsule CVA.  eICU Interventions  New Patient Evaluation completed.        [name] U Layne Dilauro 03/04/2020, 3:20 AM

## 2020-03-04 NOTE — Care Management (Signed)
This is a no charge note  PCCM pick up from Dr. Mortimer Fries  69 year old lady with past medical history of hypertension, stage IV colon cancer with perforation, who presents with acute stroke, hypertensive emergency, AKI.  Neurology is on board.  Palliative care is consulted.   Ivor Costa, MD  [facility]   If 7PM-7AM, please contact night-coverage [email or web address] 03/04/2020, 6:29 PM

## 2020-03-04 NOTE — Progress Notes (Signed)
*  PRELIMINARY RESULTS* Echocardiogram 2D Echocardiogram has been performed.  Madeline Park 03/04/2020, 8:28 AM

## 2020-03-04 NOTE — Progress Notes (Signed)
OT Cancellation Note  Patient Details Name: Madeline Park MRN: [id number] DOB: 01-30-1951   Cancelled Treatment:    Reason Eval/Treat Not Completed: Medical issues which prohibited therapy   OT consult received and chart reviewed. Upon chart review, nursing notes indicate that pt with acute neuro change with stat CT ordered, revealing: "Acute infarct posterior limb internal capsule left shows progressive low-density edema compared with CT yesterday; Atrophy. Advanced chronic small vessel ischemic changes. No acute hemorrhage." Will hold at this time and f/u as pt becomes more appropriate for therapy participation. Thank you.  Gerrianne Scale, [place], OTR/L ascom [phone] 03/04/20, 1:03 PM

## 2020-03-04 NOTE — Progress Notes (Addendum)
Pt with acute neuro change. NIH scale below. Md (Reynolds) at bedside. STAT head CT ordered.    03/04/20 1000  NIH Stroke Scale ( + Modified Stroke Scale Criteria)   Interval Neuro change  Level of Consciousness (1a.)    2  LOC Questions (1b. )   + 2  LOC Commands (1c. )   +  2  Best Gaze (2. )  + 1  Visual (3. )  + 0  Facial Palsy (4. )     1  Motor Arm, Left (5a. )   + 3  Motor Arm, Right (5b. )   + 3  Motor Leg, Left (6a. )   + 3  Motor Leg, Right (6b. )   + 3  Limb Ataxia (7. ) 2  Sensory (8. )   + 1  Best Language (9. )   + 3  Dysarthria (10. ) 2  Extinction/Inattention (11.)   + 2  Modified SS Total  + 23  Complete NIHSS TOTAL 30

## 2020-03-04 NOTE — Progress Notes (Signed)
The Clinical status was relayed to family in detail. Dr Trenton Gammon (ENT in [place]) and  Brother Waunita Schooner Updated and notified of patients medical condition.   Explained to family course of therapy and the modalities     Patient with Progressive multiorgan failure with very low chance of meaningful recovery despite all aggressive and optimal medical therapy.  Process associated with suffering.  Family understands the situation.  They have consented and agreed to DNR/DNI and would like to proceed with Comfort care measures if patient decompensates  Family are satisfied with Plan of action and management. All questions answered  Additional CC time 32 mins   Madeline Park Patricia Pesa, M.D.  [facility]  Medical Director [place] Director [facility] Cardio-Pulmonary Department

## 2020-03-04 NOTE — Progress Notes (Signed)
Pharmacy Electrolyte Monitoring Consult:  Pharmacy consulted to assist in monitoring and replacing electrolytes in this 69 y.o. female admitted on 03/03/2020 with Altered Mental Status and Weakness   Labs:  Sodium (mmol/L)  Date Value  03/03/2020 136  12/13/2011 129 (L)   Potassium (mmol/L)  Date Value  03/03/2020 3.3 (L)  12/13/2011 3.5   Magnesium (mg/dL)  Date Value  12/13/2011 2.2   Calcium (mg/dL)  Date Value  03/03/2020 9.3   Calcium, Total (mg/dL)  Date Value  12/13/2011 8.9   Albumin (g/dL)  Date Value  03/03/2020 4.2  12/11/2011 2.4 (L)    Assessment/Plan: Patient here AMS s/t hypertensive emergency that led to acute ischemic infarct in posterior lobe limb w/ multiple scattered micro hemorrhages from uncontrolled HTN.  Will replace w/ KCI 10 mEq IV x 4 and recheck electrolytes w/ am labs and continue to monitor.  Tobie Lords, PharmD, BCPS Clinical Pharmacist 03/04/2020 1:18 AM

## 2020-03-04 NOTE — Progress Notes (Signed)
PT Cancellation Note  Patient Details Name: LIYA STROLLO MRN: [id number] DOB: [birth year]   Cancelled Treatment:    Reason Eval/Treat Not Completed: Patient not medically ready;Medical issues which prohibited therapy. Chart reviewed and PT order received. Patient with recent acute neuro change and awaiting new imaging per notes. PT will hold and follow up when medically appropriate.     Minna Merritts, PT, MPT  Percell Locus 03/04/2020, 1:01 PM

## 2020-03-05 DIAGNOSIS — E785 Hyperlipidemia, unspecified: Secondary | ICD-10-CM | POA: Diagnosis present

## 2020-03-05 DIAGNOSIS — E782 Mixed hyperlipidemia: Secondary | ICD-10-CM

## 2020-03-05 DIAGNOSIS — I10 Essential (primary) hypertension: Secondary | ICD-10-CM

## 2020-03-05 DIAGNOSIS — Z515 Encounter for palliative care: Secondary | ICD-10-CM

## 2020-03-05 DIAGNOSIS — Z7189 Other specified counseling: Secondary | ICD-10-CM

## 2020-03-05 DIAGNOSIS — N179 Acute kidney failure, unspecified: Secondary | ICD-10-CM

## 2020-03-05 DIAGNOSIS — E538 Deficiency of other specified B group vitamins: Secondary | ICD-10-CM

## 2020-03-05 LAB — BASIC METABOLIC PANEL
Anion gap: 10 (ref 5–15)
BUN: 33 mg/dL — ABNORMAL HIGH (ref 8–23)
CO2: 26 mmol/L (ref 22–32)
Calcium: 9.3 mg/dL (ref 8.9–10.3)
Chloride: 102 mmol/L (ref 98–111)
Creatinine, Ser: 2.2 mg/dL — ABNORMAL HIGH (ref 0.44–1.00)
GFR calc Af Amer: 26 mL/min — ABNORMAL LOW (ref >60)
GFR calc non Af Amer: 23 mL/min — ABNORMAL LOW (ref >60)
Glucose, Bld: 107 mg/dL — ABNORMAL HIGH (ref 70–99)
Potassium: 3.8 mmol/L (ref 3.5–5.1)
Sodium: 138 mmol/L (ref 135–145)

## 2020-03-05 LAB — HEMOGLOBIN A1C
Hgb A1c MFr Bld: 5.3 % (ref 4.8–5.6)
Mean Plasma Glucose: 105 mg/dL

## 2020-03-05 LAB — CBC
HCT: 41.3 % (ref 36.0–46.0)
Hemoglobin: 14.1 g/dL (ref 12.0–15.0)
MCH: 28.4 pg (ref 26.0–34.0)
MCHC: 34.1 g/dL (ref 30.0–36.0)
MCV: 83.1 fL (ref 80.0–100.0)
Platelets: 300 10*3/uL (ref 150–400)
RBC: 4.97 MIL/uL (ref 3.87–5.11)
RDW: 14.1 % (ref 11.5–15.5)
WBC: 7.8 10*3/uL (ref 4.0–10.5)
nRBC: 0 % (ref 0.0–0.2)

## 2020-03-05 LAB — THYROID PANEL WITH TSH
Free Thyroxine Index: 1.9 (ref 1.2–4.9)
T3 Uptake Ratio: 26 % (ref 24–39)
T4, Total: 7.3 ug/dL (ref 4.5–12.0)
TSH: 1.82 u[IU]/mL (ref 0.450–4.500)

## 2020-03-05 LAB — MAGNESIUM: Magnesium: 1.7 mg/dL (ref 1.7–2.4)

## 2020-03-05 LAB — PHOSPHORUS: Phosphorus: 3.6 mg/dL (ref 2.5–4.6)

## 2020-03-05 MED ORDER — AMLODIPINE BESYLATE 5 MG PO TABS
5.0000 mg | ORAL_TABLET | Freq: Every day | ORAL | Status: DC
  Administered 2020-03-05: 5 mg via ORAL
  Filled 2020-03-05: qty 1

## 2020-03-05 MED ORDER — SODIUM CHLORIDE 0.9 % IV SOLN: INTRAVENOUS | Status: DC

## 2020-03-05 MED ORDER — ASPIRIN 325 MG PO TABS
325.0000 mg | ORAL_TABLET | Freq: Every day | ORAL | Status: DC
  Administered 2020-03-05 – 2020-03-13 (×9): 325 mg via ORAL
  Filled 2020-03-05 (×9): qty 1

## 2020-03-05 MED ORDER — LABETALOL HCL 5 MG/ML IV SOLN
20.0000 mg | Freq: Once | INTRAVENOUS | Status: AC
  Administered 2020-03-05: 20 mg via INTRAVENOUS
  Filled 2020-03-05: qty 4

## 2020-03-05 MED ORDER — SIMVASTATIN 20 MG PO TABS
20.0000 mg | ORAL_TABLET | Freq: Every day | ORAL | Status: DC
  Administered 2020-03-05 – 2020-03-12 (×8): 20 mg via ORAL
  Filled 2020-03-05 (×8): qty 1

## 2020-03-05 NOTE — Evaluation (Signed)
Clinical/Bedside Swallow Evaluation Patient Details  Name: Madeline Park MRN: [id number] Date of Birth: Mar 03, 1951  Today's Date: 03/05/2020 Time: SLP Start Time (ACUTE ONLY): 0815 SLP Stop Time (ACUTE ONLY): 5732 SLP Time Calculation (min) (ACUTE ONLY): 40 min  Past Medical History:  Past Medical History:  Diagnosis Date  . B12 deficiency   . Cancer [facility])    found after colon perforation in 2003, Rectal, Stage IV; S/P surgery, chemo.  no recurrence since chemo concluded 04,  followed at [facility]  . Fistula   . Hypertension   . Nausea    responds to reglan and zofran together  . Peritonitis ([place])    after colon perforation 2003  . Skin cancer    [facility]   Past Surgical History:  Past Surgical History:  Procedure Laterality Date  . ABDOMINAL HYSTERECTOMY    . APPENDECTOMY    . COLOSTOMY     intially 2003, takedown 2006  . ENTEROCUTANEOUS FISTULA CLOSURE    . ILEOSTOMY     take down 2013   HPI:  Pt is a 69 yo female with a PMH of Skin Cancer, Peritonitis, HTN, Metastatic Rectal Cancer to the Liver (dx 11/2002 s/p Hartmann and LAR with Ileostomy), and B12 Deficiency.  She presented to [facility] ER on 06/30 after the brother reported she was "not acting like herself," had slurred speech, and frequent falls. Per ER notes pts brother reported he had unsuccessfully tried to contact the pt over the past week, therefore he went to pts home and noticed the pt was confused.  Upon arrival to the ER pt had continued slurred speech, anomic aphasia, right-sided weakness, and rapid alternating movement on the right side. Pt extremely hypertensive bp 250s/160's, therefore nicardipine gtt initiated.  CT Head negative for acute intracranial hemorrhage or intracranial mass lesion, however revealed age indeterminate hypodensity/possible infarcts within the thalamus/basal ganglia/white matter.  MR Brain revealed 15 mm acute ischemic nonhemorrhagic infarct involving the posterior limb of the left  internal capsule, with an additional 5 mm acute ischemic nonhemorrhagic infarct involving the adjacent left subinsular white matter; "Extensive chronic microvascular ischemic changes throughout the white matter bilaterally; chronic infarcts; Atrophy".  Pt lives at home alone per report.    Assessment / Plan / Recommendation Clinical Impression  Pt appears to present w/ adequate oropharyngeal phase swallow w/ No oropharyngeal phase dysphagia noted, No significant neuromuscular deficits noted impacting oral intake, swallowing.Pt consumed po trials w/ No overt, clinical s/s of aspiration during po trials. Pt appears at reduced risk for aspiration following general aspiration precautions. During po trials, pt consumed all consistencies w/ no overt coughing, decline in vocal quality, or change in respiratory presentation during/post trials. O2 sats remained 99%. Oral phase appeared grossly [facility] w/ timely bolus management, mastication, and control of bolus propulsion for A-P transfer for swallowing. Oral clearing achieved w/ all trial consistencies. No anterior loss noted. OM Exam appeared slightly mild-WFL w/ slight+ R unilateral labial decreased tone noted; No lingual weakness or deviation noted. Speech Clear. Pt exhibited Min decreased Cognitive attention and follow through w/ tasks -- unsure of pt's Baseline Cognitive status prior. Gentle, min cues increased accuracy of follow through w/ tasks. Pt fed self w/ setup support. Recommend a more Mech Soft consistency diet w/ well-Cut meats and foods for ease of self-feeding d/t R sided weakness, moistened foods; Thin liquids. Recommend general aspiration precautions, Pills WHOLE in Puree for safer, easier swallowing as pt described Larger pills causing difficulty to swallow("Potassium"). Education  given on Pills in Puree; food consistencies and easy to eat options; general aspiration precautions. NSG to reconsult if any new needs arise. NSG agreed.  SLP Visit  Diagnosis: Dysphagia, unspecified (R13.10)    Aspiration Risk   (reduced following general precautions)    Diet Recommendation  Mech Soft diet w/ gravies to moisten Cut foods; Thin liquids. General aspiration precautions.  Medication Administration: Whole meds with puree (for ease of swallowing)    Other  Recommendations Recommended Consults:  (OT/PT; Cognitive-language assessment) Oral Care Recommendations: Oral care BID;Oral care before and after PO;Patient independent with oral care (staff support) Other Recommendations:  (n/a)   Follow up Recommendations None      Frequency and Duration  (n/a)   (n/a)       Prognosis Prognosis for Safe Diet Advancement: Good Barriers to Reach Goals: Cognitive deficits;Time post onset;Severity of deficits      Swallow Study   General Date of Onset: 03/03/20 HPI: Pt is a 69 yo female with a PMH of Skin Cancer, Peritonitis, HTN, Metastatic Rectal Cancer to the Liver (dx 11/2002 s/p Hartmann and LAR with Ileostomy), and B12 Deficiency.  She presented to [facility] ER on 06/30 after the brother reported she was "not acting like herself," had slurred speech, and frequent falls. Per ER notes pts brother reported he had unsuccessfully tried to contact the pt over the past week, therefore he went to pts home and noticed the pt was confused.  Upon arrival to the ER pt had continued slurred speech, anomic aphasia, right-sided weakness, and rapid alternating movement on the right side. Pt extremely hypertensive bp 250s/160's, therefore nicardipine gtt initiated.  CT Head negative for acute intracranial hemorrhage or intracranial mass lesion, however revealed age indeterminate hypodensity/possible infarcts within the thalamus/basal ganglia/white matter.  MR Brain revealed 15 mm acute ischemic nonhemorrhagic infarct involving the posterior limb of the left internal capsule, with an additional 5 mm acute ischemic nonhemorrhagic infarct involving the adjacent left  subinsular white matter; "Extensive chronic microvascular ischemic changes throughout the white matter bilaterally; chronic infarcts; Atrophy".  Pt lives at home alone per report.  Type of Study: Bedside Swallow Evaluation Previous Swallow Assessment: none Diet Prior to this Study: Regular;Thin liquids Temperature Spikes Noted: No (wbc 7.8) Respiratory Status: Room air History of Recent Intubation: No Behavior/Cognition: Alert;Cooperative;Pleasant mood;Confused;Distractible;Requires cueing Oral Cavity Assessment: Within Functional Limits Oral Care Completed by SLP: Recent completion by staff Oral Cavity - Dentition: Adequate natural dentition Vision: Functional for self-feeding Self-Feeding Abilities: Able to feed self;Needs assist;Needs set up Patient Positioning: Upright in bed (needed min positioning) Baseline Vocal Quality: Normal Volitional Cough: Strong Volitional Swallow: Able to elicit    Oral/Motor/Sensory Function Overall Oral Motor/Sensory Function: Mild impairment Facial ROM: Reduced right (slight+) Facial Symmetry: Abnormal symmetry right (slight+) Facial Strength: Within Functional Limits Facial Sensation: Within Functional Limits Lingual ROM: Within Functional Limits Lingual Symmetry: Within Functional Limits Lingual Strength: Within Functional Limits Lingual Sensation: Within Functional Limits Velum: Within Functional Limits Mandible: Within Functional Limits   Ice Chips Ice chips: Not tested   Thin Liquid Thin Liquid: Within functional limits Presentation: Cup;Self Fed;Straw (~4-5 ozs total)    Nectar Thick Nectar Thick Liquid: Not tested   Honey Thick Honey Thick Liquid: Not tested   Puree Puree: Within functional limits Presentation: Self Fed;Spoon (3 trials)   Solid     Solid: Within functional limits Presentation: Self Fed;Spoon (3 trials)       Orinda Kenner, MS, CCC-SLP Lyzette Reinhardt 03/05/2020,1:45 PM

## 2020-03-05 NOTE — Progress Notes (Signed)
°  PROGRESS NOTE  Madeline Park KXF:[id number] DOB: Sep 20, 1950 DOA: 03/03/2020 PCP: Tonia Ghent, MD  Brief History   69 year old lady with past medical history of hypertension, stage IV colon cancer with perforation, who presents with acute stroke, hypertensive emergency, AKI.  Neurology is on board.  Palliative care is consulted.  Consultants   Neurology  Procedures   None  Antibiotics   Anti-infectives (From admission, onward)   None      Subjective  No new complaints.  Objective   Vitals:  Vitals:   03/05/20 1500 03/05/20 1600  BP: (!) 169/93 (!) 179/99  Pulse: 92 79  Resp: (!) 21 17  Temp:  97.9 F (36.6 C)  SpO2: 100% 100%    Exam:  Constitutional:   The patient is awake, alert, and oriented x 3. No acute distress. Respiratory:   No increased work of breathing.  No wheezes, rales, or rhonchi  No tactile fremitus Cardiovascular:   Regular rate and rhythm  No murmurs, ectopy, or gallups.  No lateral PMI. No thrills. Abdomen:   Abdomen is soft, non-tender, non-distended  No hernias, masses, or organomegaly  Normoactive bowel sounds.  Musculoskeletal:   No cyanosis, clubbing, or edema Skin:   No rashes, lesions, ulcers  palpation of skin: no induration or nodules Neurologic:   CN 2-12 intact except for rt facial droop  Sensation all 4 extremities intact Psychiatric:   Mental status o Mood, affect appropriate o Orientation to person, place, time   judgment and insight appear intact  I have personally reviewed the following:   Today's Data   Vitals, BMP, CBC  Scheduled Meds:  amLODipine  5 mg Oral Daily   aspirin  325 mg Oral Daily   Chlorhexidine Gluconate Cloth  6 each Topical Daily   simvastatin  20 mg Oral q1800   Continuous Infusions: IV NS 125 cc/hr  Problem  Hyperlipidemia  Stroke ([facility])  Vitamin B12 Deficiency  AKI (acute kidney injury) ([place])  Essential Hypertension    LOS: 1 day   A & P    Acute CVA of left internal capsular and subinsular infarcts: The patient has been evaluated by neurology. She has been started on ASA 325 mg daily with simvastatin.   Hypertensive emergency: The patient is off of nicardipine drip. Will keep systolic blood pressure below 220, and slowly bring them down to a normal range.  Hyperlipidemia: LDL is 78. Goal LSL is below 70. Start simvastatin 20 mg daily.  AKI: On admission creatinine was 1.11. It appears that that is likely her baseline. This morning it has increased to 2.20. Will start IV fluids, avoid nephrotoxins, and hypotension. Will monitor creatinine, electrolytes, and volume status.  Vitamin B 12 deficiency: Noted. Supplement  History of rectal cancer Stage IV with perf and peritonitis in 2003. Noted.   I have seen and examined this patient myself. I have spent 35 minutes in her evaluation and care.  DVT Prophylaxis: SCD's CODE STATUS: DNR Family Communication: None available Disposition: Patient is from home. Anticipate discharge to rehab Barriers to discharge. Control of blood pressure and rehab eval and placement.  Rosanna Bickle, DO [facility] Direct contact: see [email or web address]  7PM-7AM contact night coverage as above 03/05/2020, 5:58 PM  LOS: 1 day

## 2020-03-05 NOTE — Evaluation (Signed)
Physical Therapy Evaluation Patient Details Name: Madeline Park MRN: [id number] DOB: June 07, 1951 Today's Date: 03/05/2020   History of Present Illness  Per MD notes: Pt is a 69 yo female with a PMH of Skin Cancer, Peritonitis, HTN, Metastatic Rectal Cancer to the Liver (dx 11/2002 s/p Hartmann and LAR with Ileostomy), and B12 Deficiency.  Pt presented to the ED with slurred speech and frequent falls.  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    Clinical Impression  Pt pleasant and motivated to participate during the session but somewhat confused and compulsive with extra time and cuing required to follow commands and to be redirected to task at hand.  Pt presented with some minor deficits in strength and coordination to the RUE/RLE compared to the left with sensation to light touch and proprioception grossly intact.  Pt presented with mildly ataxic gait with min drifting right/left and occasional scissoring; pt easily distracted during amb with frequent cues for direction and to avoid obstacles.  Pt will benefit from PT services in an IR setting upon discharge to safely address deficits listed in patient problem list for decreased caregiver assistance and eventual return to PLOF.      Follow Up Recommendations CIR    Equipment Recommendations  Rolling walker with 5" wheels    Recommendations for Other Services       Precautions / Restrictions Precautions Precautions: Fall Restrictions Weight Bearing Restrictions: No      Mobility  Bed Mobility Overal bed mobility: Needs Assistance Bed Mobility: Supine to Sit;Sit to Supine     Supine to sit: Min assist Sit to supine: Min assist   General bed mobility comments: Min A for BLE and trunk control  Transfers Overall transfer level: Needs assistance Equipment used: Rolling walker (2 wheeled) Transfers: Sit to/from Stand Sit to  Stand: +2 safety/equipment         General transfer comment: Fair control and stability with cues for hand placement  Ambulation/Gait Ambulation/Gait assistance: +2 safety/equipment Gait Distance (Feet): 100 Feet Assistive device: Rolling walker (2 wheeled) Gait Pattern/deviations: Step-through pattern;Drifts right/left;Scissoring;Ataxic Gait velocity: decreased   General Gait Details: Mildly ataxic gait with min drifting right/left and occasional scissoring; pt easily distracted during amb with frequent cues for direction and to avoid obstacles  Stairs            Wheelchair Mobility    Modified Rankin (Stroke Patients Only)       Balance Overall balance assessment: Needs assistance Sitting-balance support: Feet supported;Single extremity supported Sitting balance-Leahy Scale: Good     Standing balance support: Bilateral upper extremity supported;During functional activity Standing balance-Leahy Scale: Fair Standing balance comment: Min to mod lean on the RW for support                             Pertinent Vitals/Pain Pain Assessment: No/denies pain    Home Living Family/patient expects to be discharged to:: Private residence Living Arrangements: Alone Available Help at Discharge: Family;Available PRN/intermittently Type of Home: House Home Access: Level entry     Home Layout: One level Home Equipment: Shower seat;Grab bars - tub/shower      Prior Function Level of Independence: Independent         Comments: Ind amb community distances without an AD, Ind with ADLs, no fall history other than multiple recent falls associated with current admission     Hand Dominance  Dominant Hand: Right    Extremity/Trunk Assessment   Upper Extremity Assessment Upper Extremity Assessment: Generalized weakness;RUE deficits/detail;LUE deficits/detail RUE Deficits / Details: Delayed response to commands with elbow flex/ext strength 3+ to 4-/5;   decreased fine motor coordination bilaterally but more pronounced on the right; RUE Sensation: WNL RUE Coordination: decreased fine motor LUE Deficits / Details: Elbow flex/ext strength 4/5 with decreased fine motor coordination bilaterally but more pronounced on the right; LUE Sensation: WNL LUE Coordination: decreased fine motor    Lower Extremity Assessment Lower Extremity Assessment: RLE deficits/detail;LLE deficits/detail RLE Deficits / Details: MMT: knee ext 3+/5, knee flex 4-/5, hip flex 3+/5, ankle DF/PF 4/5 RLE Sensation: WNL RLE Coordination: decreased gross motor LLE Deficits / Details: LLE strength WFL throughout LLE Sensation: WNL LLE Coordination: WNL       Communication   Communication: No difficulties  Cognition Arousal/Alertness: Awake/alert Behavior During Therapy: Impulsive Overall Cognitive Status: No family/caregiver present to determine baseline cognitive functioning                                 General Comments: Pt A&O to self and partly to location and date      General Comments      Exercises Other Exercises Other Exercises: Sit to/from stand transfer training   Assessment/Plan    PT Assessment Patient needs continued PT services  PT Problem List Decreased strength;Decreased activity tolerance;Decreased balance;Decreased mobility;Decreased coordination;Decreased knowledge of use of DME;Decreased safety awareness       PT Treatment Interventions DME instruction;Gait training;Functional mobility training;Therapeutic activities;Therapeutic exercise;Balance training;Patient/family education    PT Goals (Current goals can be found in the Care Plan section)  Acute Rehab PT Goals Patient Stated Goal: To move better PT Goal Formulation: With patient Time For Goal Achievement: 03/18/20 Potential to Achieve Goals: Good    Frequency 7X/week   Barriers to discharge Inaccessible home environment;Decreased caregiver support       Co-evaluation               AM-PAC PT "6 Clicks" Mobility  Outcome Measure Help needed turning from your back to your side while in a flat bed without using bedrails?: A Little Help needed moving from lying on your back to sitting on the side of a flat bed without using bedrails?: A Little Help needed moving to and from a bed to a chair (including a wheelchair)?: A Little Help needed standing up from a chair using your arms (e.g., wheelchair or bedside chair)?: A Little Help needed to walk in hospital room?: A Little Help needed climbing 3-5 steps with a railing? : A Lot 6 Click Score: 17    End of Session Equipment Utilized During Treatment: Gait belt Activity Tolerance: Patient tolerated treatment well Patient left: in bed;with call bell/phone within reach;with bed alarm set Nurse Communication: Mobility status PT Visit Diagnosis: Unsteadiness on feet (R26.81);Repeated falls (R29.6);Muscle weakness (generalized) (M62.81);Ataxic gait (R26.0);Hemiplegia and hemiparesis Hemiplegia - Right/Left: Right Hemiplegia - dominant/non-dominant: Dominant Hemiplegia - caused by: Cerebral infarction    Time: 0940-1016 PT Time Calculation (min) (ACUTE ONLY): 36 min   Charges:   PT Evaluation $PT Eval Moderate Complexity: 1 Mod PT Treatments $Gait Training: 8-22 mins        D. Scott Dakwan Pridgen PT, DPT 03/05/20, 11:04 AM

## 2020-03-05 NOTE — Progress Notes (Signed)
Subjective Off Nicardipine. R facial droop still present.   Past Medical History:  Diagnosis Date  . B12 deficiency   . Cancer [facility])    found after colon perforation in 2003, Rectal, Stage IV; S/P surgery, chemo.  no recurrence since chemo concluded 04,  followed at [facility]  . Fistula   . Hypertension   . Nausea    responds to reglan and zofran together  . Peritonitis ([place])    after colon perforation 2003  . [facility]    [facility]    Past Surgical History:  Procedure Laterality Date  . ABDOMINAL HYSTERECTOMY    . APPENDECTOMY    . COLOSTOMY     intially 2003, takedown 2006  . ENTEROCUTANEOUS FISTULA CLOSURE    . ILEOSTOMY     take down 2013    Family History  Problem Relation Age of Onset  . Dementia Mother   . Stroke Father   . Multiple myeloma Father   . Heart disease Father   . Colon cancer Neg Hx   . Breast cancer Neg Hx    Social History:  reports that she quit smoking about 15 years ago. Her smoking use included cigarettes. She has a 45.00 pack-year smoking history. She has never used smokeless tobacco. She reports current alcohol use. She reports that she does not use drugs.  Allergies:  Allergies  Allergen Reactions  . Ivp Dye [Iodinated Diagnostic Agents] Rash    Rash and itching  . Promethazine Hcl     Muscle spasms  . Imodium [Loperamide]     Rash  . Metoprolol Other (See Comments)    GI upset, not a true allergic reaction.   . Molasses     Itching, rash  . Propofol [Propofol]     vomiting  . Lisinopril Rash    Medications:  I have reviewed the patient's current medications. Prior to Admission:  Medications Prior to Admission  Medication Sig Dispense Refill Last Dose  . cetirizine (ZYRTEC) 10 MG tablet Take by mouth.   Past Month at PRN  . cloNIDine (CATAPRES) 0.2 MG tablet Take 1 tablet (0.2 mg total) by mouth 3 (three) times daily. 270 tablet 3 Past Month at Unknown time  . cyanocobalamin (,VITAMIN B-12,) 1000 MCG/ML injection INJECT  1ML INTO THE MUSCLE TWICE A MONTH. 5 mL 12 Past Month at Unknown   . diphenhydrAMINE (BENADRYL) 25 mg capsule Take by mouth.   Past Month at PRN  . Multiple Vitamins-Minerals (MULTIVITAMIN WITH MINERALS) tablet Take by mouth.   Past Month at Unknown time  . ondansetron (ZOFRAN) 4 MG tablet TAKE 1 TABLET BY MOUTH EVERY 8 HOURS AS NEEDED FOR NAUSEA 40 tablet 12 Past Month at PRN  . Psyllium (METAMUCIL PO) Take 5 capsules by mouth daily as needed.   Past Month at PRN  . Vitamin D, Ergocalciferol, (DRISDOL) 1.25 MG (50000 UNIT) CAPS capsule Take 1 capsule (50,000 Units total) by mouth every 7 (seven) days. 13 capsule 0 Past Month at Unknown time  . cholestyramine (QUESTRAN) 4 GM/DOSE powder TAKE ONE SCOOPFUL BY MOUTH 3 (THREE) TIMES DAILY AS NEEDED (WITH A MEAL). 378 g 1    Scheduled: . amLODipine  5 mg Oral Daily  . Chlorhexidine Gluconate Cloth  6 each Topical Daily    ROS: Unable to obtain due to mental status  Physical Examination: Blood pressure (!) 182/105, pulse 82, temperature 97.8 F (36.6 C), temperature source Oral, resp. rate (!) 23, height '5\' 7"'  (1.702  m), weight 64.8 kg, SpO2 100 %.   Neurological Examination   Mental Status: Lethargic.  Eyes open and awake  Able to speak in full sentences.  Cranial Nerves: II: Does not blink to bilateral confrontation, pupils equal, round, reactive to light and accommodation III,IV, VI: ptosis not present, extra-ocular motions intact bilaterally V,VII: right facial droop VIII: hearing normal bilaterally IX,X: gag reflex present XI: unable to test XII: unable to test Motor: RUE and RLE drift  minimal drift which has improved from yesterday  Sensory: Does not respond to noxious stimuli on the RUE with decreased response on the RLE.   Deep Tendon Reflexes: Symmetric throughout Plantars: Right: mute   Left: mute     Laboratory Studies:  Basic Metabolic Panel: Recent Labs  Lab 03/03/20 1855 03/04/20 0528 03/05/20 0404  NA 136 138  138  K 3.3* 3.9 3.8  CL 101 101 102  CO2 '26 23 26  ' GLUCOSE 97 90 107*  BUN 24* 18 33*  CREATININE 1.43* 1.11* 2.20*  CALCIUM 9.3 9.1 9.3  MG  --   --  1.7  PHOS  --   --  3.6    Liver Function Tests: Recent Labs  Lab 03/03/20 1855  AST 20  ALT 16  ALKPHOS 59  BILITOT 1.4*  PROT 7.1  ALBUMIN 4.2   No results for input(s): LIPASE, AMYLASE in the last 168 hours. No results for input(s): AMMONIA in the last 168 hours.  CBC: Recent Labs  Lab 03/03/20 1855 03/04/20 0520 03/05/20 0404  WBC 6.7 5.9 7.8  NEUTROABS 4.6 4.5  --   HGB 13.5 13.7 14.1  HCT 40.5 41.4 41.3  MCV 85.4 85.4 83.1  PLT 306 307 300    Cardiac Enzymes: No results for input(s): CKTOTAL, CKMB, CKMBINDEX, TROPONINI in the last 168 hours.  BNP: Invalid input(s): POCBNP  CBG: Recent Labs  Lab 03/04/20 0236 03/04/20 0746 03/04/20 1119 03/04/20 1539  GLUCAP 96 94 94 81    Microbiology: Results for orders placed or performed during the hospital encounter of 03/03/20  SARS Coronavirus 2 by RT PCR (hospital order, performed in [facility] hospital lab) Nasopharyngeal Nasopharyngeal Swab     Status: None   Collection Time: 03/04/20  2:20 AM   Specimen: Nasopharyngeal Swab  Result Value Ref Range Status   SARS Coronavirus 2 NEGATIVE NEGATIVE Final    Comment: (NOTE) SARS-CoV-2 target nucleic acids are NOT DETECTED.  The SARS-CoV-2 RNA is generally detectable in upper and lower respiratory specimens during the acute phase of infection. The lowest concentration of SARS-CoV-2 viral copies this assay can detect is 250 copies / mL. A negative result does not preclude SARS-CoV-2 infection and should not be used as the sole basis for treatment or other patient management decisions.  A negative result may occur with improper specimen collection / handling, submission of specimen other than nasopharyngeal swab, presence of viral mutation(s) within the areas targeted by this assay, and inadequate number  of viral copies (<250 copies / mL). A negative result must be combined with clinical observations, patient history, and epidemiological information.  Fact Sheet for Patients:   StrictlyIdeas.no  Fact Sheet for Healthcare Providers: BankingDealers.co.za  This test is not yet approved or  cleared by the [place] FDA and has been authorized for detection and/or diagnosis of SARS-CoV-2 by FDA under an Emergency Use Authorization (EUA).  This EUA will remain in effect (meaning this test can be used) for the duration of the COVID-19 declaration under  Section 564(b)(1) of the Act, 21 U.S.C. section 360bbb-3(b)(1), unless the authorization is terminated or revoked sooner.  Performed at [facility], [address] [place], [place] [address]   MRSA PCR Screening     Status: None   Collection Time: 03/04/20  2:39 AM   Specimen: Nasopharyngeal  Result Value Ref Range Status   MRSA by PCR NEGATIVE NEGATIVE Final    Comment:        The GeneXpert MRSA Assay (FDA approved for NASAL specimens only), is one component of a comprehensive MRSA colonization surveillance program. It is not intended to diagnose MRSA infection nor to guide or monitor treatment for MRSA infections. Performed at [facility], [address] [place], [place] [address]     Coagulation Studies: Recent Labs    03/03/20 1855  LABPROT 12.9  INR 1.0    Urinalysis:  Recent Labs  Lab 03/03/20 2003  COLORURINE STRAW*  LABSPEC 1.008  PHURINE 6.0  GLUCOSEU NEGATIVE  HGBUR NEGATIVE  BILIRUBINUR NEGATIVE  KETONESUR NEGATIVE  PROTEINUR NEGATIVE  NITRITE NEGATIVE  LEUKOCYTESUR NEGATIVE    Lipid Panel:    Component Value Date/Time   CHOL 213 (H) 03/04/2020 0520   TRIG 54 03/04/2020 0520   HDL 124 03/04/2020 0520   CHOLHDL 1.7 03/04/2020 0520   VLDL 11 03/04/2020 0520   LDLCALC 78 03/04/2020 0520    HgbA1C:  Lab Results   Component Value Date   HGBA1C 5.3 03/04/2020    Urine Drug Screen:  No results found for: LABOPIA, COCAINSCRNUR, LABBENZ, AMPHETMU, THCU, LABBARB  Alcohol Level: No results for input(s): ETH in the last 168 hours.  Other results: EKG: sinus rhythm at 74 bpm.  Imaging: CT HEAD WO CONTRAST  Result Date: 03/04/2020 CLINICAL DATA:  Altered mental status. Right-sided weakness. Stroke. EXAM: CT HEAD WITHOUT CONTRAST TECHNIQUE: Contiguous axial images were obtained from the base of the skull through the vertex without intravenous contrast. COMPARISON:  CT and MRI 03/03/2020 FINDINGS: Brain: Acute infarct posterior limb internal capsule shows progressive low-density. Extensive chronic microvascular ischemic changes throughout the white matter bilaterally. Chronic infarcts in the thalamus bilaterally left greater than right. Mild atrophy. Negative for hydrocephalus. Chronic ischemic changes in the cerebellum bilaterally. Negative for hemorrhage or mass lesion. Vascular: Negative for hyperdense vessel Skull: Negative Sinuses/Orbits: Paranasal sinuses clear.  Negative orbit Other: None IMPRESSION: Acute infarct posterior limb internal capsule left shows progressive low-density edema compared with CT yesterday. Atrophy. Advanced chronic small vessel ischemic changes. No acute hemorrhage. Electronically Signed   By: Franchot Gallo M.D.   On: 03/04/2020 11:41   CT HEAD WO CONTRAST  Result Date: 03/03/2020 CLINICAL DATA:  Confusion EXAM: CT HEAD WITHOUT CONTRAST TECHNIQUE: Contiguous axial images were obtained from the base of the skull through the vertex without intravenous contrast. COMPARISON:  None. FINDINGS: Brain: No hemorrhage or intracranial mass is visualized. Extensive white matter hypodensity. Age indeterminate hypodensity within the left greater than right thalamus and basal ganglia. Mild ventricular enlargement. Vascular: No hyperdense vessels.  Carotid vascular calcification Skull: Normal. Negative  for fracture or focal lesion. Sinuses/Orbits: No acute finding. Other: None IMPRESSION: 1. Negative for acute intracranial hemorrhage or intracranial mass lesion. 2. Age indeterminate hypodensity/possible infarcts within the thalamus, basal ganglia and white matter. Advanced chronic small vessel ischemic change of the white matter. MRI could be considered for further evaluation given extensive white matter hypodensity and lack of prior studies for comparison to determine chronicity. Electronically Signed   By: Donavan Foil  M.D.   On: 03/03/2020 19:38   MR BRAIN WO CONTRAST  Result Date: 03/04/2020 CLINICAL DATA:  Initial evaluation for acute ataxia, stroke suspected. EXAM: MRI HEAD WITHOUT CONTRAST TECHNIQUE: Multiplanar, multiecho pulse sequences of the brain and surrounding structures were obtained without intravenous contrast. COMPARISON:  Prior CT from earlier the same day. FINDINGS: Brain: Examination moderately degraded by motion artifact. Generalized age-related cerebral atrophy. Extensive patchy and confluent T2/FLAIR hyperintensity seen throughout the periventricular and deep white matter both cerebral hemispheres. Extensive patchy involvement of the deep gray nuclei, brainstem, and cerebellum. Findings most likely related to advanced chronic microvascular ischemic disease. Multiple superimposed remote lacunar infarcts present about the bilateral basal ganglia/thalami. 15 mm focus of diffusion abnormality seen involving the posterior limb of the left internal capsule, consistent with an acute ischemic infarct (series 5, image 26). Additional 5 mm acute ischemic infarct noted involving the adjacent subinsular white matter (series 5, image 23). No associated hemorrhage or mass effect. No other evidence for acute or subacute ischemia. Gray-white matter differentiation otherwise maintained. No acute intracranial hemorrhage. Innumerable scattered chronic micro hemorrhages seen throughout the brain,  preponderance of which are clustered about the cerebellum and thalami, likely related to poorly controlled hypertension. No mass lesion, midline shift or mass effect. No hydrocephalus or extra-axial fluid collection. Pituitary gland suprasellar region within normal limits. Midline structures intact. Vascular: Major intracranial vascular flow voids are maintained. Skull and upper cervical spine: Craniocervical junction within normal limits. Bone marrow signal intensity normal. No scalp soft tissue abnormality. Sinuses/Orbits: Globes and orbital soft tissues within normal limits. Paranasal sinuses are largely clear. No significant mastoid effusion. Inner ear structures grossly normal. Other: None. IMPRESSION: 1. 15 mm acute ischemic nonhemorrhagic infarct involving the posterior limb of the left internal capsule, with an additional 5 mm acute ischemic nonhemorrhagic infarct involving the adjacent left subinsular white matter. 2. Innumerable scattered chronic micro hemorrhages throughout the brain, preponderance of which are clustered about the cerebellum and thalami, likely related to poorly controlled hypertension. 3. Underlying age-related cerebral atrophy with advanced chronic microvascular ischemic disease. Electronically Signed   By: Jeannine Boga M.D.   On: 03/04/2020 00:22   US Carotid Bilateral  Result Date: 03/04/2020 CLINICAL DATA:  69 year old female with stroke EXAM: BILATERAL CAROTID DUPLEX ULTRASOUND TECHNIQUE: Pearline Cables scale imaging, color Doppler and duplex ultrasound were performed of bilateral carotid and vertebral arteries in the neck. COMPARISON:  None. FINDINGS: Criteria: Quantification of carotid stenosis is based on velocity parameters that correlate the residual internal carotid diameter with NASCET-based stenosis levels, using the diameter of the distal internal carotid lumen as the denominator for stenosis measurement. The following velocity measurements were obtained: RIGHT ICA:   Systolic 71 cm/sec, Diastolic 19 cm/sec CCA:  67 cm/sec SYSTOLIC ICA/CCA RATIO:  1.1 ECA:  57 cm/sec LEFT ICA:  Systolic 58 cm/sec, Diastolic 17 cm/sec CCA:  61 cm/sec SYSTOLIC ICA/CCA RATIO:  0.9 ECA:  58 cm/sec Right Brachial SBP: Not acquired Left Brachial SBP: Not acquired RIGHT CAROTID ARTERY: No significant calcifications of the right common carotid artery. Intermediate waveform maintained. Heterogeneous and partially calcified plaque at the right carotid bifurcation. No significant lumen shadowing. Low resistance waveform of the right ICA. No significant tortuosity. RIGHT VERTEBRAL ARTERY: Antegrade flow with low resistance waveform. LEFT CAROTID ARTERY: No significant calcifications of the left common carotid artery. Intermediate waveform maintained. Heterogeneous and partially calcified plaque at the left carotid bifurcation without significant lumen shadowing. Low resistance waveform of the left ICA. No significant tortuosity. LEFT VERTEBRAL  ARTERY:  Antegrade flow with low resistance waveform. IMPRESSION: Color duplex indicates minimal heterogeneous and calcified plaque, with no hemodynamically significant stenosis by duplex criteria in the extracranial cerebrovascular circulation. Signed, Dulcy Fanny. Dellia Nims, RPVI [facility] Electronically Signed   By: Corrie Mckusick D.O.   On: 03/04/2020 14:10   ECHOCARDIOGRAM COMPLETE  Result Date: 03/04/2020    ECHOCARDIOGRAM REPORT   Patient Name:   Madeline Park Date of Exam: 03/04/2020 Medical Rec #:  [id number]         Height:       67.0 in Accession #:    [id number]        Weight:       143.3 lb Date of Birth:  [birth year]         BSA:          1.755 m Patient Age:    69 years          BP:           177/94 mmHg Patient Gender: F                 HR:           94 bpm. Exam Location:  [facility] Procedure: 2D Echo, Cardiac Doppler and Color Doppler Indications:     Stroke 434.91  History:         Patient has no  prior history of Echocardiogram examinations.                  Risk Factors:Hypertension.  Sonographer:     Sherrie Sport RDCS (AE) Referring Phys:  [id number] Awilda Bill Diagnosing Phys: Neoma Laming MD  Sonographer Comments: No apical window and no subcostal window. IMPRESSIONS  1. Left ventricular ejection fraction, by estimation, is 60 to 65%. The left ventricle has normal function. The left ventricle has no regional wall motion abnormalities. There is severe concentric left ventricular hypertrophy. Left ventricular diastolic  function could not be evaluated.  2. Right ventricular systolic function is normal. The right ventricular size is mildly enlarged.  3. Left atrial size was mild to moderately dilated.  4. Right atrial size was mild to moderately dilated.  5. The mitral valve is normal in structure. No evidence of mitral valve regurgitation. No evidence of mitral stenosis.  6. The aortic valve is normal in structure. Aortic valve regurgitation is trivial. No aortic stenosis is present.  7. The inferior vena cava is normal in size with greater than 50% respiratory variability, suggesting right atrial pressure of 3 mmHg. Conclusion(s)/Recommendation(s): Findings consistent with hypertensive heart disease. No intracardiac source of embolism detected on this transthoracic study. A transesophageal echocardiogram is recommended to exclude cardiac source of embolism if clinically indicated. FINDINGS  Left Ventricle: Left ventricular ejection fraction, by estimation, is 60 to 65%. The left ventricle has normal function. The left ventricle has no regional wall motion abnormalities. The left ventricular internal cavity size was normal in size. There is  severe concentric left ventricular hypertrophy. Left ventricular diastolic function could not be evaluated. Right Ventricle: The right ventricular size is mildly enlarged. No increase in right ventricular wall thickness. Right ventricular systolic function is normal.  Left Atrium: Left atrial size was mild to moderately dilated. Right Atrium: Right atrial size was mild to moderately dilated. Pericardium: Trivial pericardial effusion is present. The pericardial effusion is circumferential. Mitral Valve: The mitral valve is normal in structure. Normal mobility of the mitral valve leaflets. No  evidence of mitral valve regurgitation. No evidence of mitral valve stenosis. Tricuspid Valve: The tricuspid valve is normal in structure. Tricuspid valve regurgitation is not demonstrated. No evidence of tricuspid stenosis. Aortic Valve: The aortic valve is normal in structure. Aortic valve regurgitation is trivial. No aortic stenosis is present. Pulmonic Valve: The pulmonic valve was normal in structure. Pulmonic valve regurgitation is not visualized. No evidence of pulmonic stenosis. Aorta: The aortic root is normal in size and structure. Venous: The inferior vena cava is normal in size with greater than 50% respiratory variability, suggesting right atrial pressure of 3 mmHg. IAS/Shunts: No atrial level shunt detected by color flow Doppler.  LEFT VENTRICLE PLAX 2D LVIDd:         4.19 cm LVIDs:         2.77 cm LV PW:         1.50 cm LV IVS:        1.76 cm LVOT diam:     2.20 cm LVOT Area:     3.80 cm  LEFT ATRIUM         Index LA diam:    3.40 cm 1.94 cm/m                        PULMONIC VALVE AORTA                 PV Vmax:        0.54 m/s Ao Root diam: 3.20 cm PV Peak grad:   1.2 mmHg                       RVOT Peak grad: 3 mmHg   SHUNTS Systemic Diam: 2.20 cm Neoma Laming MD Electronically signed by Neoma Laming MD Signature Date/Time: 03/04/2020/11:28:03 AM    Final     Assessment: 69 y.o. female with a PMH of Skin Cancer, Peritonitis, HTN, Metastatic Rectal Cancer to the Liver (dx 11/2002 s/p Jeanette Caprice and LAR with Ileostomy), and B12 Deficiency who presented to [facility] ER on 06/30 after the brother reported she was "not acting like herself," had slurred speech, and frequent falls. Per ER  notes brother reported he had unsuccessfully tried to contact the patient over the past week, therefore he went to her home and noticed that she was confused.  Unclear date of onset of symptoms.  Initial NIHSS of 3.  Patient markedly hypertensive.  MRI of the brain personally reviewed and reveals acute left internal capsular and subinsular infarcts.    - Much improved. Able to follow commands. R facial droop still present - Off Nicardipine gtt - anti platelet therapy/statin - transfer out of ICU - pt/ot and d/c planning

## 2020-03-05 NOTE — Consult Note (Addendum)
Consultation Note Date: 03/05/2020   Patient Name: Madeline Park  DOB: [birth year]  MRN: [id number]  Age / Sex: 69 y.o., female  PCP: Tonia Ghent, MD Referring Physician: Karie Kirks, DO  Reason for Consultation: Establishing goals of care  HPI/Patient Profile:  69 y.o. female with a PMH of Skin Cancer, Peritonitis, HTN, Metastatic Rectal Cancer to the Liver (dx 11/2002 s/p Jeanette Caprice and LAR with Ileostomy), and B12 Deficiency who presented to [facility] ER on 06/30 after the brother reported she was "not acting like herself," had slurred speech, and frequent falls.   Clinical Assessment and Goals of Care: Patient is resting in bed. She is confused stating she is in [place] and is in a hotel room.   Was not able to reach sister who is first contact. Spoke with brother who is also listed in demographics. 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 She has never been married or had children.  He states for the past month she has been to the grocery store daily but has no place to put the groceries.    He states he and his MD brother have talked to CCM and the plan is to treat the treatable as long as patient does not decline. He states as long as she can work with PT/OT, and her vitals are good, they would like to continue care. He states she would never want a feeding tube. He states she is a very independent woman and would not be happy living in a facility if she does not recover such that she can go home. At that time, brother stated that he needed to end conversation.      SUMMARY OF RECOMMENDATIONS   Treat the treatable. Recommend palliative at D/C.   Prognosis:   Unable to determine      Primary Diagnoses: Present on Admission: . Stroke [facility])   I have reviewed the medical record, interviewed the patient and family, and examined the  patient. The following aspects are pertinent.  Past Medical History:  Diagnosis Date  . B12 deficiency   . Cancer [facility])    found after colon perforation in 2003, Rectal, Stage IV; S/P surgery, chemo.  no recurrence since chemo concluded 04,  followed at [facility]  . Fistula   . Hypertension   . Nausea    responds to reglan and zofran together  . Peritonitis ([place])    after colon perforation 2003  . Skin cancer    per [facility] derm- [facility]   Social History   Socioeconomic History  . Marital status: Single    Spouse name: Not on file  . Number of children: Not on file  . Years of education: Not on file  . Highest education level: Not on file  Occupational History  . Not on file  Tobacco Use  . Smoking status: Former Smoker    Packs/day: 1.50    Years: 30.00    Pack years: 45.00  Types: Cigarettes    Quit date: 09/04/2004    Years since quitting: 15.5  . Smokeless tobacco: Never Used  Substance and Sexual Activity  . Alcohol use: Yes    Comment: occ beer  . Drug use: No  . Sexual activity: Never  Other Topics Concern  . Not on file  Social History Narrative   [employer] grad   Single, prev in same sex relationship   No kids   Prev worked for [employer]   [employer]   Social Determinants of Radio broadcast assistant Strain:   . Difficulty of Paying Living Expenses:   Food Insecurity:   . Worried About Charity fundraiser in the Last Year:   . Arboriculturist in the Last Year:   Transportation Needs:   . Film/video editor (Medical):   Marland Kitchen Lack of Transportation (Non-Medical):   Physical Activity:   . Days of Exercise per Week:   . Minutes of Exercise per Session:   Stress:   . Feeling of Stress :   Social Connections:   . Frequency of Communication with Friends and Family:   . Frequency of Social Gatherings with Friends and Family:   . Attends Religious Services:   . Active Member of Clubs or Organizations:   . Attends Archivist Meetings:   Marland Kitchen Marital Status:     Family History  Problem Relation Age of Onset  . Dementia Mother   . Stroke Father   . Multiple myeloma Father   . Heart disease Father   . Colon cancer Neg Hx   . Breast cancer Neg Hx    Scheduled Meds: . amLODipine  5 mg Oral Daily  . Chlorhexidine Gluconate Cloth  6 each Topical Daily   Continuous Infusions: PRN Meds:.docusate sodium, hydrALAZINE, ondansetron (ZOFRAN) IV, polyethylene glycol Medications Prior to Admission:  Prior to Admission medications   Medication Sig Start Date End Date Taking? Authorizing Provider  cetirizine (ZYRTEC) 10 MG tablet Take by mouth.   Yes [provider]  cloNIDine (CATAPRES) 0.2 MG tablet Take 1 tablet (0.2 mg total) by mouth 3 (three) times daily. 02/20/20  Yes Tonia Ghent, MD  cyanocobalamin (,VITAMIN B-12,) 1000 MCG/ML injection INJECT 1ML INTO THE MUSCLE TWICE A MONTH. 02/17/20  Yes Tonia Ghent, MD  diphenhydrAMINE (BENADRYL) 25 mg capsule Take by mouth.   Yes [provider]  Multiple Vitamins-Minerals (MULTIVITAMIN WITH MINERALS) tablet Take by mouth.   Yes [provider]  ondansetron (ZOFRAN) 4 MG tablet TAKE 1 TABLET BY MOUTH EVERY 8 HOURS AS NEEDED FOR NAUSEA 01/28/20  Yes Tonia Ghent, MD  Psyllium (METAMUCIL PO) Take 5 capsules by mouth daily as needed.   Yes [provider]  Vitamin D, Ergocalciferol, (DRISDOL) 1.25 MG (50000 UNIT) CAPS capsule Take 1 capsule (50,000 Units total) by mouth every 7 (seven) days. 02/20/20  Yes Tonia Ghent, MD  cholestyramine Lucrezia Starch) 4 GM/DOSE powder TAKE ONE SCOOPFUL BY MOUTH 3 (THREE) TIMES DAILY AS NEEDED (WITH A MEAL). 12/17/19   Tonia Ghent, MD   Allergies  Allergen Reactions  . Ivp Dye [Iodinated Diagnostic Agents] Rash    Rash and itching  . Promethazine Hcl     Muscle spasms  . Imodium [Loperamide]     Rash  . Metoprolol Other (See Comments)    GI upset, not a true allergic reaction.   . Molasses     Itching, rash  . Propofol  [Propofol]  vomiting  . Lisinopril Rash   Review of Systems  All other systems reviewed and are negative.   Physical Exam Pulmonary:     Effort: Pulmonary effort is normal.  Neurological:     Mental Status: She is alert.     Vital Signs: BP (!) 169/93   Pulse 92   Temp 97.8 F (36.6 C) (Oral)   Resp (!) 21   Ht _0  (1.702 m)   Wt 64.8 kg   SpO2 100%   BMI 22.37 kg/m  Pain Scale: 0-10   Pain Score: 0-No pain   SpO2: SpO2: 100 % O2 Device:SpO2: 100 % O2 Flow Rate: .   IO: Intake/output summary:   Intake/Output Summary (Last 24 hours) at 03/05/2020 1552 Last data filed at 03/05/2020 1351 Gross per 24 hour  Intake 590 ml  Output 150 ml  Net 440 ml    LBM: Last BM Date: 03/05/20 Baseline Weight: Weight: 68 kg Most recent weight: Weight: 64.8 kg     Palliative Assessment/Data:     Time In: 3:30 Time Out: 4:05 Time Total: 35 min Greater than 50%  of this time was spent counseling and coordinating care related to the above assessment and plan.  Signed by: Asencion Gowda, NP   Please contact Palliative Medicine Team phone at [phone] for questions and concerns.  For individual provider: See Shea Evans

## 2020-03-05 NOTE — Progress Notes (Signed)
Rehab Admissions Coordinator Note:  Patient was screened by Cleatrice Burke for appropriateness for an Inpatient Acute Rehab Consult. Noted Rehab consult placed by Therapy. An Admissions Coordinator will follow up for full assessment.   Cleatrice Burke RN MSN 03/05/2020, 12:15 PM  I can be reached at [phone].

## 2020-03-05 NOTE — Evaluation (Signed)
Occupational Therapy Evaluation Patient Details Name: Madeline Park MRN: [id number] DOB: 03/13/1951 Today's Date: 03/05/2020    History of Present Illness Per MD notes: Pt is a 69 yo female with a PMH of Skin Cancer, Peritonitis, HTN, Metastatic Rectal Cancer to the Liver (dx 11/2002 s/p Hartmann and LAR with Ileostomy), and B12 Deficiency.  Pt presented to the ED with slurred speech and frequent falls.  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   Clinical Impression   Pt presents this afternoon agreeable to session, easily distracted throughout, frequently using humor and requiring redirection. Prior to hospitalization, pt lived alone in a 1 level home, 3 STE and a tub-shower. She was independent in ADL/IADL and still drives. Per conversation with pt's brother, pt's home is not appropriate for her to return to. Plan is for pt to move in with him following discharge in home with 2 STE and walk-in shower. During session, pt demonstrates deficits in orientation (only oriented to self), is easily distracted and has difficulties following 2-3 step commands for neuro screen. Her speech is garbled at times and she demonstrates difficulties with word-finding. Pt is right hand dominant, demonstrating fine motor coordination deficits bilaterally with R > L, unable to incorporate RUE into RAM. During functional task, pt uses left hand to reach for wash cloth for face washing with significant multimodal cueing for RUE engagement. Pt completes bed mobility with Standby Assist and sit <> stand with CGA, requiring extensive cues for sequencing and consistent one step commands throughout. Pt will benefit from skilled acute OT services to address pt deficits in fine motor coordination, gross motor coordination, right side weakness and cognitive communication impacting participation in functional ADL. Recommend CIR as most  appropriate discharge option at this time.      Follow Up Recommendations  CIR    Equipment Recommendations  Other (comment) (TBD at next venue)    Recommendations for Other Services       Precautions / Restrictions Precautions Precautions: Fall Restrictions Weight Bearing Restrictions: No      Mobility Bed Mobility Overal bed mobility: Needs Assistance Bed Mobility: Supine to Sit;Sit to Supine     Supine to sit: Supervision (SBA) Sit to supine: Supervision (SBA)   General bed mobility comments: required extensive multimodal cues for bed mobility and repositioning  Transfers Overall transfer level: Needs assistance   Transfers: Sit to/from Stand Sit to Stand: Min guard         General transfer comment: stood from EOB with fair control, cues for safety provided    Balance Overall balance assessment: Needs assistance Sitting-balance support: Feet supported;Single extremity supported Sitting balance-Leahy Scale: Good Sitting balance - Comments: steady balance reaching outside BOS   Standing balance support: No upper extremity supported Standing balance-Leahy Scale: Fair Standing balance comment: able to maintain standing balance during 3-4 lateral steps                           ADL either performed or assessed with clinical judgement   ADL Overall ADL's : Needs assistance/impaired     Grooming: Wash/dry face;Minimal assistance;Cueing for sequencing;Sitting Grooming Details (indicate cue type and reason): washcloth placed into pt's right hand, significant multimodal cues required to initiate and complete task  Functional mobility during ADLs: Min guard;Cueing for sequencing;Cueing for safety       Vision Baseline Vision/History: Wears glasses Wears Glasses: At all times Additional Comments: difficulties following commands during visual assessment, no significant functional deficits observed      Perception     Praxis      Pertinent Vitals/Pain Pain Assessment: No/denies pain     Hand Dominance Right   Extremity/Trunk Assessment Upper Extremity Assessment Upper Extremity Assessment: Generalized weakness;RUE deficits/detail;LUE deficits/detail RUE Deficits / Details: Delayed response to commands with elbow flex/ext strength 3+ to 4-/5;  decreased fine motor coordination bilaterally but more pronounced on the right; unable to incorporate RUE during RAM RUE Sensation: WNL RUE Coordination: decreased fine motor;decreased gross motor LUE Deficits / Details: Elbow flex/ext strength 4/5 with decreased fine motor coordination bilaterally but more pronounced on the right LUE Sensation: WNL LUE Coordination: decreased fine motor   Lower Extremity Assessment Lower Extremity Assessment: Defer to PT evaluation;Generalized weakness RLE Coordination: decreased gross motor   Cervical / Trunk Assessment Cervical / Trunk Assessment:  (forward posture in sitting and standing)   Communication Communication Communication: Expressive difficulties (some garbled speech and word-finding difficulties)   Cognition Arousal/Alertness: Awake/alert Behavior During Therapy: Impulsive Overall Cognitive Status: Impaired/Different from baseline Area of Impairment: Orientation                 Orientation Level: Disoriented to;Time;Situation             General Comments: Pt A&O to self. Difficulties with following 2-3 step commands during neuro screen. Easily distracted and impulsive.   General Comments       Exercises Other Exercises Other Exercises: Education on functional use of right side for stroke recovery, safety and falls prevention Other Exercises: Facilitated grooming task at EOB to work on functional reach and use of upper extremities   Shoulder Instructions      Home Living Family/patient expects to be discharged to:: Private residence Living Arrangements:  Alone Available Help at Discharge: Family;Available PRN/intermittently Type of Home: House Home Access: Level entry     Home Layout: One level     Bathroom Shower/Tub: Teacher, early years/pre: Standard     Home Equipment: Shower seat;Grab bars - tub/shower   Additional Comments: Per conversation with pt's brother, pt's home is not appropriate living conditions. The plan is for her to move in with him permenantly following discharge. His home has 2 STE and walk-in shower.      Prior Functioning/Environment Level of Independence: Independent        Comments: Ind amb community distances without an AD, Ind with ADLs, no fall history other than multiple recent falls associated with current admission; still driving and grocery shopping        OT Problem List: Decreased strength;Impaired UE functional use;Decreased coordination;Decreased cognition;Decreased safety awareness;Decreased range of motion;Decreased activity tolerance;Impaired balance (sitting and/or standing)      OT Treatment/Interventions: Self-care/ADL training;Therapeutic exercise;Therapeutic activities;Cognitive remediation/compensation;Patient/family education;Balance training    OT Goals(Current goals can be found in the care plan section) Acute Rehab OT Goals Patient Stated Goal: To feel better OT Goal Formulation: With patient Time For Goal Achievement: 03/19/20 Potential to Achieve Goals: Good ADL Goals Pt Will Perform Grooming: with min assist;sitting (using RUE with fewer than 3 verbal cues) Pt Will Perform Upper Body Dressing: sitting;with min assist (with fewer than 3 verbal cues) Additional ADL Goal #1: Pt will incorporate RUE into functional reaching task while sitting EOB with fewer than  3 verbal or tactile cues  OT Frequency: Min 3X/week   Barriers to D/C:            Co-evaluation              AM-PAC OT "6 Clicks" Daily Activity     Outcome Measure Help from another person  eating meals?: A Little Help from another person taking care of personal grooming?: A Lot Help from another person toileting, which includes using toliet, bedpan, or urinal?: A Little Help from another person bathing (including washing, rinsing, drying)?: A Lot Help from another person to put on and taking off regular upper body clothing?: A Lot Help from another person to put on and taking off regular lower body clothing?: A Lot 6 Click Score: 14   End of Session Nurse Communication: Other (comment) (Pt performance following session)  Activity Tolerance: Patient tolerated treatment well Patient left: in bed;with call bell/phone within reach;with bed alarm set  OT Visit Diagnosis: Other abnormalities of gait and mobility (R26.89);Other symptoms and signs involving cognitive function;Cognitive communication deficit (R41.841);Muscle weakness (generalized) (M62.81);Hemiplegia and hemiparesis Symptoms and signs involving cognitive functions: Cerebral infarction Hemiplegia - Right/Left: Right Hemiplegia - dominant/non-dominant: Dominant Hemiplegia - caused by: Cerebral infarction                Time: 3428-7681 OT Time Calculation (min): 36 min Charges:  OT General Charges $OT Visit: 1 Visit OT Evaluation $OT Eval High Complexity: 1 High OT Treatments $Self Care/Home Management : 8-22 mins $Therapeutic Activity: 8-22 mins  Jerilynn Birkenhead, OTS 03/05/20, 4:31 PM

## 2020-03-05 NOTE — Progress Notes (Signed)
Pharmacy Electrolyte Monitoring Consult:  Pharmacy consulted to assist in monitoring and replacing electrolytes in this 69 y.o. female admitted on 03/03/2020 with Altered Mental Status and Weakness  Patient with MRI showing acute left internal capsular and subinsular infarcts as well as chronic microhemorrhages. Further decline in neurological status 7/1 am with CTH negative for hemorrhagic transformation but with edema in the area of the left internal capsular infarct. Patient with significantly elevated BP on arrival, has since improved and now off nicardipine drip.  Labs:  Sodium (mmol/L)  Date Value  03/05/2020 138  12/13/2011 129 (L)   Potassium (mmol/L)  Date Value  03/05/2020 3.8  12/13/2011 3.5   Magnesium (mg/dL)  Date Value  03/05/2020 1.7  12/13/2011 2.2   Phosphorus (mg/dL)  Date Value  03/05/2020 3.6   Calcium (mg/dL)  Date Value  03/05/2020 9.3   Calcium, Total (mg/dL)  Date Value  12/13/2011 8.9   Albumin (g/dL)  Date Value  03/03/2020 4.2  12/11/2011 2.4 (L)    Assessment/Plan: Current electrolytes WNL. Defer frequency of monitoring to primary MD. Will continue to follow along as needed.  Dorena Bodo, PharmD Clinical Pharmacist 03/05/2020 3:07 PM

## 2020-03-05 NOTE — Evaluation (Addendum)
Speech Language Pathology Evaluation Patient Details Name: Madeline Park MRN: [id number] DOB: Jun 18, 1951 Today's Date: 03/05/2020 Time: 8101-7510 SLP Time Calculation (min) (ACUTE ONLY): 45 min  Problem List:  Patient Active Problem List   Diagnosis Date Noted  . Stroke ([place]) 03/04/2020  . Vitamin D deficiency 01/19/2019  . Healthcare maintenance 10/18/2016  . Osteopenia 10/29/2015  . Advance care planning 08/05/2014  . Short gut syndrome 08/05/2014  . Medicare annual wellness visit, subsequent 08/13/2012  . Vitamin B12 deficiency 08/13/2012  . History of colon cancer, stage IV 06/16/2008  . Essential hypertension 12/11/2007   Past Medical History:  Past Medical History:  Diagnosis Date  . B12 deficiency   . Cancer [facility])    found after colon perforation in 2003, Rectal, Stage IV; S/P surgery, chemo.  no recurrence since chemo concluded 04,  followed at [facility]  . Fistula   . Hypertension   . Nausea    responds to reglan and zofran together  . Peritonitis ([place])    after colon perforation 2003  . [facility] cancer    [facility]   Past Surgical History:  Past Surgical History:  Procedure Laterality Date  . ABDOMINAL HYSTERECTOMY    . APPENDECTOMY    . COLOSTOMY     intially 2003, takedown 2006  . ENTEROCUTANEOUS FISTULA CLOSURE    . ILEOSTOMY     take down 2013   HPI:  Pt is a 69 yo female with a PMH of Skin Cancer, Peritonitis, HTN, Metastatic Rectal Cancer to the Liver (dx 11/2002 s/p Hartmann and LAR with Ileostomy), and B12 Deficiency, stopped tobacco/smoking ~15 yrs ago, current alcohol use per Neuro note.  She presented to [facility] ER on 06/30 after the brother reported she was "not acting like herself," had slurred speech, and frequent falls. Per ER notes pts brother reported he had unsuccessfully tried to contact the pt over the past week, therefore he went to pts home and noticed the pt was confused.  Upon arrival to the ER pt had continued slurred speech, anomic  aphasia, right-sided weakness, and rapid alternating movement on the right side. Pt extremely hypertensive bp 250s/160's, therefore nicardipine gtt initiated.  CT Head negative for acute intracranial hemorrhage or intracranial mass lesion, however revealed age indeterminate hypodensity/possible infarcts within the thalamus/basal ganglia/white matter.  MR Brain revealed 15 mm acute ischemic nonhemorrhagic infarct involving the posterior limb of the left internal capsule, with an additional 5 mm acute ischemic nonhemorrhagic infarct involving the adjacent left subinsular white matter; "Extensive chronic microvascular ischemic changes throughout the white matter bilaterally; chronic infarcts; Atrophy".  Pt lives at home alone per report.    Assessment / Plan / Recommendation Clinical Impression  Pt presented to the hospital after being found poorly responsive at home w/ R sided weakness. Family had been unsuccessfully trying to contact the pt over the past week. She also presented w/ difficulty w/ word finding deficits and was extremely hypertensive with BP 250s/160's. Head CT amd MR Brain revealed possible chronic infarcts and a 15 mm acute ischemic nonhemorrhagic infarct involving the posterior limb of the left internal capsule, with an additional 5 mm acute ischemic nonhemorrhagic infarct involving the adjacent left subinsular white matter; "Extensive chronic microvascular ischemic changes throughout the white matter bilaterally; chronic infarcts; Atrophy". Pt lives at home alone per report. She appears to present w/ Mild-Mod Expressive and Receptive language deficits, Aphasia, noted in higher level, more complex language tasks. This was noted primarily in Sequential Commands, Naming, and  Repetition tasks, Complex y/n tasks. During verbal decision making and organization tasks such as Naming items used in then How to do a task(make a bed), verbal cues were needed to direct attention and complete the verbal  tasks. This was also noted in Categorization and likes/differences tasks when she had to use verbal language to express thoughts. During Naming of items at breakfast meal, word-finding deficits and perseveration were noted (expressive language tasks). During Automatic Speech tasks, pt was unable to complete naming Days of Week stating "there are only 3 days, which ones do I say?". Verbal cues helped to increase accuracy/completion of verbal tasks but as Complexity of tasks increased, she was unable to answer and/or complete the task. It MUST be noted that pt conversed in general conversation re: TV show on, herself, and her Dogs(Ruthie and Boo) w/ no immediate, apparent deficits noted(when not challenged). However, she exhibited deficits in ST Memory, could not give details of where she lived, she continued to be distracted by her environment and looked out the door of her room, and frequently asked "who is that big guy following me?"(referring to her Nurse). Moderate+ Cognitive awareness and orientation of self/environment deficits noted; pt stated she lived in [place], she was oriented to Name and Year only. She is constantly distracted by the environment and requires verbal cues to redirect. Unsure of pt's Baseline Cognitive functioning PRIOR to this admit as per Head CT results and report of home issues ongoing for ~1 month prior. Pt lives alone at home so unsure of knowing full extent. Recommend Neurology f/u for assessment.  Recommend f/u w/ skilled ST services at Discharge in order to continue to more formally assessexpressive and receptive language abilities(Aphasia) as well as more formally assess Cognitive function/status to address compensatory strategies in order to improve function and communication in ADLs. Recommend 100% Supervision at Discharge d/t Safety Awareness concerns and problem-solving.  Recommend Neurology f/u at Discharge in order to formally assess Cognitive functioning/status for  POC in light of uncertainty of pt's Baseline Cognitive functioning. Reports of some decline per family prior to admit.     SLP Assessment  SLP Recommendation/Assessment: All further Speech Lanaguage Pathology  needs can be addressed in the next venue of care SLP Visit Diagnosis: Cognitive communication deficit (R41.841)    Follow Up Recommendations  Skilled Nursing facility (TBD)    Frequency and Duration  (TBD)   (TBD)      SLP Evaluation Cognition  Overall Cognitive Status: No family/caregiver present to determine baseline cognitive functioning (reported recent deficits for ~1 month or so) Arousal/Alertness: Awake/alert Orientation Level: Oriented to person;Oriented to place Attention: Focused;Sustained Focused Attention: Impaired Focused Attention Impairment: Verbal complex;Functional complex Sustained Attention: Impaired Sustained Attention Impairment: Verbal complex;Functional complex Memory: Impaired Memory Impairment: Decreased recall of new information (2 words given; breakfast items on tray) Awareness: Impaired Awareness Impairment: Anticipatory impairment Problem Solving: Impaired (correcting items on tray, positioning in bed) Problem Solving Impairment: Verbal complex;Functional complex Executive Function:  (DNT) Behaviors: Restless;Impulsive;Perseveration Safety/Judgment: Impaired       Comprehension  Auditory Comprehension Overall Auditory Comprehension: Impaired Yes/No Questions: Impaired Commands: Impaired (2 step consistently) Conversation: Complex (more errors noted) Other Conversation Comments: perseveration w/ words Interfering Components: Attention EffectiveTechniques: Repetition Visual Recognition/Discrimination Discrimination: Not tested Reading Comprehension Reading Status: Not tested    Expression Expression Primary Mode of Expression: Verbal Verbal Expression Overall Verbal Expression: Impaired Initiation: No impairment Automatic Speech:  Name;Social Response;Counting (could Not do days, months) Level of Generative/Spontaneous Verbalization: Phrase Repetition: No  impairment Naming: Impairment Responsive: 51-75% accurate Confrontation: Impaired Divergent: 25-49% accurate Verbal Errors: Perseveration;Not aware of errors Pragmatics: Impairment Impairments:  (impulsive) Interfering Components: Attention Non-Verbal Means of Communication: Not applicable Written Expression Dominant Hand: Right (weakness) Written Expression: Not tested   Oral / Motor  Oral Motor/Sensory Function Overall Oral Motor/Sensory Function: Mild impairment Facial ROM: Reduced right (slight+) Facial Symmetry: Abnormal symmetry right (slight+) Facial Strength: Within Functional Limits (grossly) Facial Sensation: Within Functional Limits Lingual ROM: Within Functional Limits Lingual Symmetry: Within Functional Limits Lingual Strength: Within Functional Limits Lingual Sensation: Within Functional Limits Velum: Within Functional Limits Mandible: Within Functional Limits Motor Speech Overall Motor Speech: Appears within functional limits for tasks assessed Respiration: Within functional limits Phonation: Normal Resonance: Within functional limits Articulation: Within functional limitis Intelligibility: Intelligible Motor Planning: Witnin functional limits Motor Speech Errors: Not applicable   GO                     Orinda Kenner, MS, CCC-SLP Evelyna Folker 03/05/2020, 5:29 PM

## 2020-03-05 NOTE — Progress Notes (Signed)
OT Cancellation Note  Patient Details Name: Madeline Park MRN: [id number] DOB: 18-Jul-1951   Cancelled Treatment:    Reason Eval/Treat Not Completed: Patient declined, no reason specified. Order received, chart reviewed. Upon arrival, pt sitting up in bed eating lunch. Introduced role of OT and attempted to begin evaluation, however pt declined and requested that she finish eating first. Agreeable to participate in evaluation later. Will re-attempt at a later time as pt available and medically appropriate.   Jerilynn Birkenhead, OTS 03/05/20, 1:25 PM

## 2020-03-05 NOTE — Progress Notes (Signed)
[facility] attempted visit while rounding on ICU; pt. lying down in bed, awake; Pt. says she does not want a visit at this time; [facility] is available if needed.    03/05/20 1130  Clinical Encounter Type  Visited With Patient  Visit Type Initial

## 2020-03-05 NOTE — Plan of Care (Signed)
  Problem: Clinical Measurements: Goal: Will remain free from infection Outcome: Progressing Goal: Respiratory complications will improve Outcome: Progressing Goal: Cardiovascular complication will be avoided Outcome: Progressing   Problem: Activity: Goal: Risk for activity intolerance will decrease Outcome: Progressing   Problem: Nutrition: Goal: Adequate nutrition will be maintained Outcome: Progressing   Problem: Elimination: Goal: Will not experience complications related to bowel motility Outcome: Progressing Goal: Will not experience complications related to urinary retention Outcome: Progressing   Problem: Pain Managment: Goal: General experience of comfort will improve Outcome: Progressing   Problem: Safety: Goal: Ability to remain free from injury will improve Outcome: Progressing   Problem: Skin Integrity: Goal: Risk for impaired skin integrity will decrease Outcome: Progressing   

## 2020-03-06 LAB — CBC
HCT: 41.1 % (ref 36.0–46.0)
Hemoglobin: 13.7 g/dL (ref 12.0–15.0)
MCH: 28.2 pg (ref 26.0–34.0)
MCHC: 33.3 g/dL (ref 30.0–36.0)
MCV: 84.7 fL (ref 80.0–100.0)
Platelets: 340 10*3/uL (ref 150–400)
RBC: 4.85 MIL/uL (ref 3.87–5.11)
RDW: 14 % (ref 11.5–15.5)
WBC: 7.2 10*3/uL (ref 4.0–10.5)
nRBC: 0 % (ref 0.0–0.2)

## 2020-03-06 LAB — BASIC METABOLIC PANEL
Anion gap: 11 (ref 5–15)
BUN: 34 mg/dL — ABNORMAL HIGH (ref 8–23)
CO2: 21 mmol/L — ABNORMAL LOW (ref 22–32)
Calcium: 9.1 mg/dL (ref 8.9–10.3)
Chloride: 105 mmol/L (ref 98–111)
Creatinine, Ser: 1.41 mg/dL — ABNORMAL HIGH (ref 0.44–1.00)
GFR calc Af Amer: 45 mL/min — ABNORMAL LOW (ref >60)
GFR calc non Af Amer: 39 mL/min — ABNORMAL LOW (ref >60)
Glucose, Bld: 105 mg/dL — ABNORMAL HIGH (ref 70–99)
Potassium: 3.9 mmol/L (ref 3.5–5.1)
Sodium: 137 mmol/L (ref 135–145)

## 2020-03-06 MED ORDER — HYDRALAZINE HCL 25 MG PO TABS
25.0000 mg | ORAL_TABLET | Freq: Four times a day (QID) | ORAL | Status: DC
  Administered 2020-03-06 – 2020-03-09 (×11): 25 mg via ORAL
  Filled 2020-03-06 (×12): qty 1

## 2020-03-06 MED ORDER — AMLODIPINE BESYLATE 10 MG PO TABS
10.0000 mg | ORAL_TABLET | Freq: Every day | ORAL | Status: DC
  Administered 2020-03-06 – 2020-03-07 (×2): 10 mg via ORAL
  Filled 2020-03-06 (×2): qty 1

## 2020-03-06 NOTE — Progress Notes (Signed)
PROGRESS NOTE  Madeline Park UDJ:[id number] DOB: [birth year] DOA: 03/03/2020 PCP: Madeline Ghent, MD  Brief History   69 year old lady with past medical history of hypertension, stage IV colon cancer with perforation, who presents with acute stroke, hypertensive emergency, AKI.  Neurology is on board.  Palliative care is consulted.  [facility] were consulted to admit the patient for further evaluation and treatment. Neurology has recommended ASA 325 and statin only. She is off of the nicardipine drip used to control her pressures although they remain somewhat elevated.  The patient has been evaluated by PT/OT. Recommendation is for CIR. Consultants  . Neurology  Procedures  . None  Antibiotics   Anti-infectives (From admission, onward)   None     Subjective  The patient is awake and alert. She is complaining of being cold. She remains a little confused.  Objective   Vitals:  Vitals:   03/06/20 1200 03/06/20 1300  BP: (!) 146/86 136/83  Pulse: 100 86  Resp: 16 11  Temp: 98.4 F (36.9 C)   SpO2: 96% 96%    Exam:  Constitutional:  . The patient is awake, alert, but a little confused. She is complaining of being cold. Respiratory:  . No increased work of breathing. . No wheezes, rales, or rhonchi . No tactile fremitus Cardiovascular:  . Regular rate and rhythm . No murmurs, ectopy, or gallups. . No lateral PMI. No thrills. Abdomen:  . Abdomen is soft, non-tender, non-distended . No hernias, masses, or organomegaly . Normoactive bowel sounds.  Musculoskeletal:  . No cyanosis, clubbing, or edema Skin:  . No rashes, lesions, ulcers . palpation of skin: no induration or nodules Neurologic:  . CN 2-12 intact except for rt facial droop . Sensation all 4 extremities intact . Motor symptoms are improving. Psychiatric:  . Mental status: Pt is a little confused this morning, but able to follow commands.  I have personally reviewed the following:    Today's Data  . Vitals, BMP, CBC  Scheduled Meds: . amLODipine  10 mg Oral Daily  . aspirin  325 mg Oral Daily  . Chlorhexidine Gluconate Cloth  6 each Topical Daily  . hydrALAZINE  25 mg Oral Q6H  . simvastatin  20 mg Oral q1800   Continuous Infusions: . sodium chloride 125 mL/hr at 03/06/20 1200   IV NS 125 cc/hr  No problems updated.  LOS: 2 days   A & P  Acute CVA of left internal capsular and subinsular infarcts: The patient has been evaluated by neurology. She has been started on ASA 325 mg daily with simvastatin.   Hypertensive emergency: The patient is off of nicardipine drip. Will keep systolic blood pressure below 220, and slowly bring them down to a normal range.  Hyperlipidemia: LDL is 78. Goal LSL is below 70. Start simvastatin 20 mg daily.  AKI: On admission creatinine was 1.11. It appears that that is likely her baseline. On the morning of 03/05/2020 it has increased to 2.20. After starting IV fluids it is down to 1.41. Will continue to avoid nephrotoxins, and hypotension. Will monitor creatinine, electrolytes, and volume status. Will reduce rate of IV fluids.  Vitamin B 12 deficiency: Noted. Supplement  History of rectal cancer Stage IV with perf and peritonitis in 2003. Noted.   I have seen and examined this patient myself. I have spent 32 minutes in her evaluation and care.  DVT Prophylaxis: SCD's CODE STATUS: DNR Family Communication: None available Disposition: Patient is from home. Anticipate discharge  to rehab Barriers to discharge. Control of blood pressure and rehab eval and placement.  Ciel Yanes, DO [facility] Direct contact: see [email or web address]  7PM-7AM contact night coverage as above 03/05/2020, 2:42 PM  LOS: 1 day

## 2020-03-06 NOTE — Progress Notes (Signed)
Patient  agitated and combative with staff. Patient refusing to have labs be drawn. Patient is redirectable for short periods of time. Tele sitter still in place and Provider  Althea Grimmer NP made aware.

## 2020-03-06 NOTE — Progress Notes (Signed)
Physical Therapy Treatment Patient Details Name: Madeline Park MRN: [id number] DOB: 08-02-1951 Today's Date: 03/06/2020    History of Present Illness Per MD notes: Pt is a 69 yo female with a PMH of Skin Cancer, Peritonitis, HTN, Metastatic Rectal Cancer to the Liver (dx 11/2002 s/p Hartmann and LAR with Ileostomy), and B12 Deficiency.  Pt presented to the ED with slurred speech and frequent falls.  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    PT Comments    PT/OT co-treat 2/2 to pt impulsivity and safety concerns. Pt was able to perform task with +1 only but 2nd person assisted with management of line/monitor. Madeline Park was mostly non verbal throughout session however can speak with max encouragement to do so. She was able to exit L side of bed with CGA for safety with tactile and verbal cueing for sequencing/technique.She requires increased time to process and perform desired task but able to follow commands consistently. She stood with RUE HHA and ambulated 200 ft with very unsteady gait kinematics. Pt tends to have scissoring and staggering R/L and is high fall risk. R neglect present throughout. She urinated on floor once returned to room. OT/PTA assisted with clean up prior to pt returning to bed and being repositioned. RN in room at conclusion of session with bed alarm in place and call bell in reach.  Pt will continue to benefit from skilled PT at DC to address deficits and return to PLOF.    Follow Up Recommendations  CIR     Equipment Recommendations  Rolling walker with 5" wheels    Recommendations for Other Services       Precautions / Restrictions Precautions Precautions: Fall Restrictions Weight Bearing Restrictions: No    Mobility  Bed Mobility Overal bed mobility: Needs Assistance Bed Mobility: Supine to Sit;Sit to Supine     Supine to sit: Supervision;Min  guard Sit to supine: Min guard   General bed mobility comments: Pt is impulsive at times and requires CGA for safety. INcresaed time to perform with repeatition of verbal/tactile cueing  Transfers Overall transfer level: Needs assistance Equipment used: 1 person hand held assist (gait belt) Transfers: Sit to/from Stand Sit to Stand: Min guard;Min assist         General transfer comment: Pt was able to STS from EOB with RUE HHA only. gait belt used throughotu for safety. +2 required for management of monitor and line.   Ambulation/Gait Ambulation/Gait assistance: +2 safety/equipment;Min assist;Mod assist Gait Distance (Feet): 200 Feet Assistive device: 1 person hand held assist Gait Pattern/deviations: Scissoring;Staggering left;Staggering right;Drifts right/left;Trunk flexed;Narrow base of support;Step-through pattern Gait velocity: decreased   General Gait Details: Pt is very unsteady on her feet. HIGH FALL RISK. Pt has several occasions of LOB/unsteadiness with therapist intervention to prevent fall. constant vcs for postuire correction and prevent scissoring.   Stairs             Wheelchair Mobility    Modified Rankin (Stroke Patients Only)       Balance Overall balance assessment: Needs assistance Sitting-balance support: Feet supported;Bilateral upper extremity supported Sitting balance-Leahy Scale: Good Sitting balance - Comments: no LOB in sitting   Standing balance support: During functional activity;Single extremity supported Standing balance-Leahy Scale: Poor Standing balance comment: pt has severe dynamic balance deficits but static balance is fair.  Cognition Arousal/Alertness: Awake/alert Behavior During Therapy: Flat affect;Impulsive;Agitated (slightly aggitated with RN) Overall Cognitive Status: No family/caregiver present to determine baseline cognitive functioning Area of Impairment:  Safety/judgement;Orientation;Following commands;Memory;Attention                 Orientation Level:  (difficulty to assess 2/2 to pt mostly non-verbal)             General Comments: Pt was able to follow commands with increased time however per OT " much less verbal today."      Exercises      General Comments        Pertinent Vitals/Pain Pain Assessment: No/denies pain    Home Living                      Prior Function            PT Goals (current goals can now be found in the care plan section) Acute Rehab PT Goals Patient Stated Goal: none stated Progress towards PT goals: Progressing toward goals    Frequency    7X/week      PT Plan Current plan remains appropriate    Co-evaluation PT/OT/SLP Co-Evaluation/Treatment: Yes Reason for Co-Treatment: Complexity of the patient's impairments (multi-system involvement);Necessary to address cognition/behavior during functional activity;For patient/therapist safety;To address functional/ADL transfers PT goals addressed during session: Mobility/safety with mobility;Balance;Strengthening/ROM        AM-PAC PT "6 Clicks" Mobility   Outcome Measure  Help needed turning from your back to your side while in a flat bed without using bedrails?: A Little Help needed moving from lying on your back to sitting on the side of a flat bed without using bedrails?: A Little Help needed moving to and from a bed to a chair (including a wheelchair)?: A Little Help needed standing up from a chair using your arms (e.g., wheelchair or bedside chair)?: A Little Help needed to walk in hospital room?: A Lot Help needed climbing 3-5 steps with a railing? : A Lot 6 Click Score: 16    End of Session Equipment Utilized During Treatment: Gait belt Activity Tolerance: Patient tolerated treatment well Patient left: in bed;with call bell/phone within reach;with bed alarm set Nurse Communication: Mobility status PT Visit  Diagnosis: Unsteadiness on feet (R26.81);Repeated falls (R29.6);Muscle weakness (generalized) (M62.81);Ataxic gait (R26.0);Hemiplegia and hemiparesis Hemiplegia - Right/Left: Right Hemiplegia - dominant/non-dominant: Dominant Hemiplegia - caused by: Cerebral infarction     Time: 1410-1441 PT Time Calculation (min) (ACUTE ONLY): 31 min  Charges:  $Gait Training: 8-22 mins                     Julaine Fusi PTA 03/06/20, 3:22 PM

## 2020-03-06 NOTE — Progress Notes (Signed)
Inpatient Rehab Admissions Coordinator:   Spoke with Pt.'s brother, Hadlyn Amero, over the phone to discuss potential CIR admission. He stated interest, but is unsure that family will be able to provide 24/7 support at discharge.  He requested time to discuss with his wife and sister before pursuing CIR admit. Will plan to see Pt. At [facility] tomorrow.    Clemens Catholic, [place], [place] Admissions Coordinator  [phone] ([place]) [phone] (office)

## 2020-03-06 NOTE — Progress Notes (Signed)
Occupational Therapy Treatment Patient Details Name: Madeline Park MRN: [id number] DOB: 05-23-1951 Today's Date: 03/06/2020    History of present illness Per MD notes: Pt is a 69 yo female with a PMH of Skin Cancer, Peritonitis, HTN, Metastatic Rectal Cancer to the Liver (dx 11/2002 s/p Hartmann and LAR with Ileostomy), and B12 Deficiency.  Pt presented to the ED with slurred speech and frequent falls.  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   OT comments  Madeline Park seen for OT/PT co-treatment on this date 2/2 impulsivity and safety concerns. Upon arrival to room pt awake, reclined in bed, agreeable to tx session. Of note, pt significantly less verbal this session. Pt required +1 for ADL tasks and mobility bur required +2 for lines/leads management and engaging pt. Pt requires CGA don/doff B socks sitting EOB following MAX multimodal cues to initiate task. MIN A + HHA for ADL t/f (+2 for line/lead mgmt) - pt required MAX VCs to look towards R during mobility - suspect R sided neglect. Upon return to room pt verbalized "bathroom" and immediately urinated on floor of room - MAX A for pericare and don/doff gown. OT attempted to engage pt in RUE THEREX seated EOB - pt unable to hold RUE in air for >5 secs; achieves isolated finger tap on 1st, 2nd, and 5th digits on first attempt but unable or unwilling to replicate for further attempts (suspect cognitive deficits). Pt making progress toward goals. Pt continues to benefit from skilled OT services to maximize return to PLOF and minimize risk of future falls, injury, caregiver burden, and readmission. Will continue to follow POC. Discharge recommendation remains appropriate.    Follow Up Recommendations  CIR    Equipment Recommendations  Other (comment) (TBD at next venue)    Recommendations for Other Services      Precautions / Restrictions  Precautions Precautions: Fall Restrictions Weight Bearing Restrictions: No       Mobility Bed Mobility Overal bed mobility: Needs Assistance Bed Mobility: Supine to Sit;Sit to Supine     Supine to sit: Min guard Sit to supine: Min guard   General bed mobility comments: MAX verbal and tactile cues sup<>sit and CGA 2/2 impulsivity   Transfers Overall transfer level: Needs assistance Equipment used: 1 person hand held assist (gait belt) Transfers: Sit to/from Stand Sit to Stand: Min assist         General transfer comment: MIN A sit<>stand at EOB c R HHA; gait belt used throughout for safety. +2 required for management of monitor and line.     Balance Overall balance assessment: Needs assistance Sitting-balance support: Feet supported;Bilateral upper extremity supported Sitting balance-Leahy Scale: Good Sitting balance - Comments: no LOB in sitting   Standing balance support: During functional activity;Single extremity supported Standing balance-Leahy Scale: Poor Standing balance comment: pt has severe dynamic balance deficits but static balance is fair.                           ADL either performed or assessed with clinical judgement   ADL Overall ADL's : Needs assistance/impaired                                       General ADL Comments: CGA don/doff B socks sitting EOB following MAX multimodal cues to initiate task. MIN  A + HHA for ADL t/f (+2 for line/lead mgmt). Upon return to room pt verbalized "bathroom" and immediately urinated on floor of room - MAX A for pericare and don/doff gown.      Vision   Vision Assessment?: Vision impaired- to be further tested in functional context Additional Comments: MAX VCs to look towards R during mobility - suspect R sided neglect    Perception     Praxis      Cognition Arousal/Alertness: Awake/alert Behavior During Therapy: Flat affect;Impulsive;Agitated Overall Cognitive Status: No  family/caregiver present to determine baseline cognitive functioning Area of Impairment: Safety/judgement;Orientation;Following commands;Memory;Attention                 Orientation Level:  (difficulty to assess 2/2 to pt mostly non-verbal)             General Comments: Pt much less verbal than last session. MAX multimodal cues to engage in task. Appears to follow one step commands c repetition and increased time         Exercises Exercises: Other exercises Other Exercises Other Exercises: Pt educated re: importance of calling to RN when wet or prior to using bathroom, falls prevention, visual scanning Other Exercises: Toileting, UBD, LBD, sup<>sit, sit<>stand, sitting/standing balance/tolerance   Shoulder Instructions       General Comments HR 124 c mobility     Pertinent Vitals/ Pain       Pain Assessment: No/denies pain  Home Living                                          Prior Functioning/Environment              Frequency  Min 3X/week        Progress Toward Goals  OT Goals(current goals can now be found in the care plan section)  Progress towards OT goals: Progressing toward goals  Acute Rehab OT Goals Patient Stated Goal: none stated OT Goal Formulation: With patient Time For Goal Achievement: 03/19/20 Potential to Achieve Goals: Good ADL Goals Pt Will Perform Grooming: with min assist;sitting ( using RUE with fewer than 3 verbal cues) Pt Will Perform Upper Body Dressing: sitting;with min assist (with fewer than 3 verbal cues) Additional ADL Goal #1: Pt will incorporate RUE into functional reaching task while sitting EOB with fewer than 3 verbal or tactile cues  Plan Discharge plan remains appropriate;Frequency remains appropriate    Co-evaluation      Reason for Co-Treatment: Complexity of the patient's impairments (multi-system involvement);Necessary to address cognition/behavior during functional activity;For  patient/therapist safety;To address functional/ADL transfers PT goals addressed during session: Mobility/safety with mobility;Proper use of DME;Balance        AM-PAC OT "6 Clicks" Daily Activity     Outcome Measure   Help from another person eating meals?: A Little Help from another person taking care of personal grooming?: A Lot Help from another person toileting, which includes using toliet, bedpan, or urinal?: A Lot Help from another person bathing (including washing, rinsing, drying)?: A Lot Help from another person to put on and taking off regular upper body clothing?: A Lot Help from another person to put on and taking off regular lower body clothing?: A Little 6 Click Score: 14    End of Session Equipment Utilized During Treatment: Gait belt  OT Visit Diagnosis: Other abnormalities of gait and mobility (R26.89);Other symptoms and signs  involving cognitive function;Cognitive communication deficit (R41.841);Muscle weakness (generalized) (M62.81);Hemiplegia and hemiparesis Symptoms and signs involving cognitive functions: Cerebral infarction Hemiplegia - Right/Left: Right Hemiplegia - dominant/non-dominant: Dominant Hemiplegia - caused by: Cerebral infarction   Activity Tolerance Patient tolerated treatment well   Patient Left in bed;with call bell/phone within reach;with nursing/sitter in room   Nurse Communication          Time: 1410-1441 OT Time Calculation (min): 31 min  Charges: OT General Charges $OT Visit: 1 Visit OT Treatments $Self Care/Home Management : 8-22 mins  Dessie Coma, M.S. OTR/L  03/06/20, 3:45 PM

## 2020-03-06 NOTE — Progress Notes (Signed)
Pharmacy Electrolyte Monitoring Consult:  Pharmacy consulted to assist in monitoring and replacing electrolytes in this 69 y.o. female admitted on 03/03/2020 with Altered Mental Status and Weakness  Patient with MRI showing acute left internal capsular and subinsular infarcts as well as chronic microhemorrhages. Further decline in neurological status 7/1 am with CTH negative for hemorrhagic transformation but with edema in the area of the left internal capsular infarct. Patient with significantly elevated BP on arrival, has since improved and now off nicardipine drip.  Labs:  Sodium (mmol/L)  Date Value  03/06/2020 137  12/13/2011 129 (L)   Potassium (mmol/L)  Date Value  03/06/2020 3.9  12/13/2011 3.5   Magnesium (mg/dL)  Date Value  03/05/2020 1.7  12/13/2011 2.2   Phosphorus (mg/dL)  Date Value  03/05/2020 3.6   Calcium (mg/dL)  Date Value  03/06/2020 9.1   Calcium, Total (mg/dL)  Date Value  12/13/2011 8.9   Albumin (g/dL)  Date Value  03/03/2020 4.2  12/11/2011 2.4 (L)    Assessment/Plan: Current electrolytes WNL. Defer frequency of monitoring to primary MD. Will continue to follow along as needed.  Eleonore Chiquito, PharmD, BCPS Clinical Pharmacist 03/06/2020 10:27 AM

## 2020-03-06 NOTE — Progress Notes (Addendum)
Shift Summary: Pt more agitated and combative today.Overnight pt apparently pulled out 2 piv and was trying to get out of bed. This morning neuro assessment the same as yesterday. NIH of 8-9 today. Pt best AxOx2, has periods of confusion and delirium. Assessed pt IV in LAC in the morning and found it leaking and not flushing well. D/C IV and attempted twice for an IV. 7 total tries between different nurses and IV team with ultrasound needed to place a 22 ga IV in the LFA. After this pt became more quiet throughout the day and was upset with staff. Pt became more responsive to talking and friendly to staff around 1600. BP was an issue max systolic today was 921 mmHg. Pt given prn hydralazine. Swayze contacted and scheduled hydralazine added to regiment. Pt BP more stable in the evening. Physical therapy worked with pt and walked down hallway. Pt continues to have incontinence issues. Multiple purewick placements tried, and offered opportunities to use bedpan or bedside commode during rounding. Neither intervention successful. Pt remains incontinent. Used barrier ointment for MAD. Sister called and updated her on pt condition.

## 2020-03-06 NOTE — Progress Notes (Signed)
Subjective A bit more confused this AM but following commands.    Past Medical History:  Diagnosis Date  . B12 deficiency   . Cancer [facility])    found after colon perforation in 2003, Rectal, Stage IV; S/P surgery, chemo.  no recurrence since chemo concluded 04,  followed at [facility]  . Fistula   . Hypertension   . Nausea    responds to reglan and zofran together  . Peritonitis ([place])    after colon perforation 2003  . [facility]    [facility]    Past Surgical History:  Procedure Laterality Date  . ABDOMINAL HYSTERECTOMY    . APPENDECTOMY    . COLOSTOMY     intially 2003, takedown 2006  . ENTEROCUTANEOUS FISTULA CLOSURE    . ILEOSTOMY     take down 2013    Family History  Problem Relation Age of Onset  . Dementia Mother   . Stroke Father   . Multiple myeloma Father   . Heart disease Father   . Colon cancer Neg Hx   . Breast cancer Neg Hx    Social History:  reports that she quit smoking about 15 years ago. Her smoking use included cigarettes. She has a 45.00 pack-year smoking history. She has never used smokeless tobacco. She reports current alcohol use. She reports that she does not use drugs.  Allergies:  Allergies  Allergen Reactions  . Ivp Dye [Iodinated Diagnostic Agents] Rash    Rash and itching  . Promethazine Hcl     Muscle spasms  . Imodium [Loperamide]     Rash  . Metoprolol Other (See Comments)    GI upset, not a true allergic reaction.   . Molasses     Itching, rash  . Propofol [Propofol]     vomiting  . Lisinopril Rash    Medications:  I have reviewed the patient's current medications. Prior to Admission:  Medications Prior to Admission  Medication Sig Dispense Refill Last Dose  . cetirizine (ZYRTEC) 10 MG tablet Take by mouth.   Past Month at PRN  . cloNIDine (CATAPRES) 0.2 MG tablet Take 1 tablet (0.2 mg total) by mouth 3 (three) times daily. 270 tablet 3 Past Month at Unknown time  . cyanocobalamin (,VITAMIN B-12,) 1000 MCG/ML injection  INJECT 1ML INTO THE MUSCLE TWICE A MONTH. 5 mL 12 Past Month at Unknown   . diphenhydrAMINE (BENADRYL) 25 mg capsule Take by mouth.   Past Month at PRN  . Multiple Vitamins-Minerals (MULTIVITAMIN WITH MINERALS) tablet Take by mouth.   Past Month at Unknown time  . ondansetron (ZOFRAN) 4 MG tablet TAKE 1 TABLET BY MOUTH EVERY 8 HOURS AS NEEDED FOR NAUSEA 40 tablet 12 Past Month at PRN  . Psyllium (METAMUCIL PO) Take 5 capsules by mouth daily as needed.   Past Month at PRN  . Vitamin D, Ergocalciferol, (DRISDOL) 1.25 MG (50000 UNIT) CAPS capsule Take 1 capsule (50,000 Units total) by mouth every 7 (seven) days. 13 capsule 0 Past Month at Unknown time  . cholestyramine (QUESTRAN) 4 GM/DOSE powder TAKE ONE SCOOPFUL BY MOUTH 3 (THREE) TIMES DAILY AS NEEDED (WITH A MEAL). 378 g 1    Scheduled: . amLODipine  10 mg Oral Daily  . aspirin  325 mg Oral Daily  . Chlorhexidine Gluconate Cloth  6 each Topical Daily  . hydrALAZINE  25 mg Oral Q6H  . simvastatin  20 mg Oral q1800    ROS: Unable to obtain due to  mental status  Physical Examination: Blood pressure (!) 202/109, pulse (!) 106, temperature 97.8 F (36.6 C), temperature source Oral, resp. rate 18, height '5\' 7"'  (1.702 m), weight 67.7 kg, SpO2 98 %.   Neurological Examination   Mental Status: Lethargic.  Eyes open and awake  Able to speak in full sentences.  Cranial Nerves: II: Does not blink to bilateral confrontation, pupils equal, round, reactive to light and accommodation III,IV, VI: ptosis not present, extra-ocular motions intact bilaterally V,VII: right facial droop VIII: hearing normal bilaterally IX,X: gag reflex present XI: unable to test XII: unable to test Motor: RUE and RLE drift  minimal drift which has improved from yesterday  Sensory: Does not respond to noxious stimuli on the RUE with decreased response on the RLE.   Deep Tendon Reflexes: Symmetric throughout Plantars: Right: mute   Left: mute     Laboratory  Studies:  Basic Metabolic Panel: Recent Labs  Lab 03/03/20 1855 03/03/20 1855 03/04/20 0528 03/05/20 0404 03/06/20 0537  NA 136  --  138 138 137  K 3.3*  --  3.9 3.8 3.9  CL 101  --  101 102 105  CO2 26  --  23 26 21*  GLUCOSE 97  --  90 107* 105*  BUN 24*  --  18 33* 34*  CREATININE 1.43*  --  1.11* 2.20* 1.41*  CALCIUM 9.3   < > 9.1 9.3 9.1  MG  --   --   --  1.7  --   PHOS  --   --   --  3.6  --    < > = values in this interval not displayed.    Liver Function Tests: Recent Labs  Lab 03/03/20 1855  AST 20  ALT 16  ALKPHOS 59  BILITOT 1.4*  PROT 7.1  ALBUMIN 4.2   No results for input(s): LIPASE, AMYLASE in the last 168 hours. No results for input(s): AMMONIA in the last 168 hours.  CBC: Recent Labs  Lab 03/03/20 1855 03/04/20 0520 03/05/20 0404 03/06/20 0537  WBC 6.7 5.9 7.8 7.2  NEUTROABS 4.6 4.5  --   --   HGB 13.5 13.7 14.1 13.7  HCT 40.5 41.4 41.3 41.1  MCV 85.4 85.4 83.1 84.7  PLT 306 307 300 340    Cardiac Enzymes: No results for input(s): CKTOTAL, CKMB, CKMBINDEX, TROPONINI in the last 168 hours.  BNP: Invalid input(s): POCBNP  CBG: Recent Labs  Lab 03/04/20 0236 03/04/20 0746 03/04/20 1119 03/04/20 1539  GLUCAP 96 94 94 81    Microbiology: Results for orders placed or performed during the hospital encounter of 03/03/20  SARS Coronavirus 2 by RT PCR (hospital order, performed in [facility] hospital lab) Nasopharyngeal Nasopharyngeal Swab     Status: None   Collection Time: 03/04/20  2:20 AM   Specimen: Nasopharyngeal Swab  Result Value Ref Range Status   SARS Coronavirus 2 NEGATIVE NEGATIVE Final    Comment: (NOTE) SARS-CoV-2 target nucleic acids are NOT DETECTED.  The SARS-CoV-2 RNA is generally detectable in upper and lower respiratory specimens during the acute phase of infection. The lowest concentration of SARS-CoV-2 viral copies this assay can detect is 250 copies / mL. A negative result does not preclude SARS-CoV-2  infection and should not be used as the sole basis for treatment or other patient management decisions.  A negative result may occur with improper specimen collection / handling, submission of specimen other than nasopharyngeal swab, presence of viral mutation(s) within the areas  targeted by this assay, and inadequate number of viral copies (<250 copies / mL). A negative result must be combined with clinical observations, patient history, and epidemiological information.  Fact Sheet for Patients:   StrictlyIdeas.no  Fact Sheet for Healthcare Providers: BankingDealers.co.za  This test is not yet approved or  cleared by the [place] FDA and has been authorized for detection and/or diagnosis of SARS-CoV-2 by FDA under an Emergency Use Authorization (EUA).  This EUA will remain in effect (meaning this test can be used) for the duration of the COVID-19 declaration under Section 564(b)(1) of the Act, 21 U.S.C. section 360bbb-3(b)(1), unless the authorization is terminated or revoked sooner.  Performed at [facility], [address] [place], [place] [address]   MRSA PCR Screening     Status: None   Collection Time: 03/04/20  2:39 AM   Specimen: Nasopharyngeal  Result Value Ref Range Status   MRSA by PCR NEGATIVE NEGATIVE Final    Comment:        The GeneXpert MRSA Assay (FDA approved for NASAL specimens only), is one component of a comprehensive MRSA colonization surveillance program. It is not intended to diagnose MRSA infection nor to guide or monitor treatment for MRSA infections. Performed at [facility], [address] [place], [place] [address]     Coagulation Studies: Recent Labs    03/03/20 1855  LABPROT 12.9  INR 1.0    Urinalysis:  Recent Labs  Lab 03/03/20 2003  COLORURINE STRAW*  LABSPEC 1.008  PHURINE 6.0  GLUCOSEU NEGATIVE  HGBUR NEGATIVE  BILIRUBINUR NEGATIVE  KETONESUR  NEGATIVE  PROTEINUR NEGATIVE  NITRITE NEGATIVE  LEUKOCYTESUR NEGATIVE    Lipid Panel:    Component Value Date/Time   CHOL 213 (H) 03/04/2020 0520   TRIG 54 03/04/2020 0520   HDL 124 03/04/2020 0520   CHOLHDL 1.7 03/04/2020 0520   VLDL 11 03/04/2020 0520   LDLCALC 78 03/04/2020 0520    HgbA1C:  Lab Results  Component Value Date   HGBA1C 5.3 03/04/2020    Urine Drug Screen:  No results found for: LABOPIA, COCAINSCRNUR, LABBENZ, AMPHETMU, THCU, LABBARB  Alcohol Level: No results for input(s): ETH in the last 168 hours.  Other results: EKG: sinus rhythm at 74 bpm.  Imaging: US Carotid Bilateral  Result Date: 03/04/2020 CLINICAL DATA:  69 year old female with stroke EXAM: BILATERAL CAROTID DUPLEX ULTRASOUND TECHNIQUE: Pearline Cables scale imaging, color Doppler and duplex ultrasound were performed of bilateral carotid and vertebral arteries in the neck. COMPARISON:  None. FINDINGS: Criteria: Quantification of carotid stenosis is based on velocity parameters that correlate the residual internal carotid diameter with NASCET-based stenosis levels, using the diameter of the distal internal carotid lumen as the denominator for stenosis measurement. The following velocity measurements were obtained: RIGHT ICA:  Systolic 71 cm/sec, Diastolic 19 cm/sec CCA:  67 cm/sec SYSTOLIC ICA/CCA RATIO:  1.1 ECA:  57 cm/sec LEFT ICA:  Systolic 58 cm/sec, Diastolic 17 cm/sec CCA:  61 cm/sec SYSTOLIC ICA/CCA RATIO:  0.9 ECA:  58 cm/sec Right Brachial SBP: Not acquired Left Brachial SBP: Not acquired RIGHT CAROTID ARTERY: No significant calcifications of the right common carotid artery. Intermediate waveform maintained. Heterogeneous and partially calcified plaque at the right carotid bifurcation. No significant lumen shadowing. Low resistance waveform of the right ICA. No significant tortuosity. RIGHT VERTEBRAL ARTERY: Antegrade flow with low resistance waveform. LEFT CAROTID ARTERY: No significant calcifications of the  left common carotid artery. Intermediate waveform maintained. Heterogeneous and partially calcified plaque at the  left carotid bifurcation without significant lumen shadowing. Low resistance waveform of the left ICA. No significant tortuosity. LEFT VERTEBRAL ARTERY:  Antegrade flow with low resistance waveform. IMPRESSION: Color duplex indicates minimal heterogeneous and calcified plaque, with no hemodynamically significant stenosis by duplex criteria in the extracranial cerebrovascular circulation. Signed, Dulcy Fanny. Dellia Nims, RPVI [facility] Radiology Electronically Signed   By: Corrie Mckusick D.O.   On: 03/04/2020 14:10    Assessment: 69 y.o. female with a PMH of Skin Cancer, Peritonitis, HTN, Metastatic Rectal Cancer to the Liver (dx 11/2002 s/p Jeanette Caprice and LAR with Ileostomy), and B12 Deficiency who presented to [facility] ER on 06/30 after the brother reported she was "not acting like herself," had slurred speech, and frequent falls. Per ER notes brother reported he had unsuccessfully tried to contact the patient over the past week, therefore he went to her home and noticed that she was confused.  Unclear date of onset of symptoms.  Initial NIHSS of 3.  Patient markedly hypertensive.  MRI of the brain personally reviewed and reveals acute left internal capsular and subinsular infarcts.    - Periods of confusion this AM but motor exam improved from 2 days ago. Able to follow commands. R facial droop still present - Off Nicardipine gtt - anti platelet therapy/statin  ASA 325 started. No need for dual anti platelet therapy given no significant stenosis on her carotid US.  - pt/ot - call/page with questions.

## 2020-03-07 LAB — BASIC METABOLIC PANEL
Anion gap: 10 (ref 5–15)
BUN: 24 mg/dL — ABNORMAL HIGH (ref 8–23)
CO2: 20 mmol/L — ABNORMAL LOW (ref 22–32)
Calcium: 9.3 mg/dL (ref 8.9–10.3)
Chloride: 108 mmol/L (ref 98–111)
Creatinine, Ser: 1.12 mg/dL — ABNORMAL HIGH (ref 0.44–1.00)
GFR calc Af Amer: 59 mL/min — ABNORMAL LOW (ref >60)
GFR calc non Af Amer: 51 mL/min — ABNORMAL LOW (ref >60)
Glucose, Bld: 84 mg/dL (ref 70–99)
Potassium: 4.9 mmol/L (ref 3.5–5.1)
Sodium: 138 mmol/L (ref 135–145)

## 2020-03-07 MED ORDER — DILTIAZEM HCL 30 MG PO TABS
30.0000 mg | ORAL_TABLET | Freq: Four times a day (QID) | ORAL | Status: DC
  Administered 2020-03-07 – 2020-03-08 (×6): 30 mg via ORAL
  Filled 2020-03-07 (×6): qty 1

## 2020-03-07 MED ORDER — LORAZEPAM 2 MG/ML IJ SOLN
0.2500 mg | Freq: Once | INTRAMUSCULAR | Status: AC
  Administered 2020-03-07: 0.25 mg via INTRAVENOUS
  Filled 2020-03-07: qty 1

## 2020-03-07 MED ORDER — ALPRAZOLAM 0.25 MG PO TABS
0.2500 mg | ORAL_TABLET | Freq: Two times a day (BID) | ORAL | Status: DC | PRN
  Administered 2020-03-07 – 2020-03-10 (×3): 0.25 mg via ORAL
  Filled 2020-03-07 (×4): qty 1

## 2020-03-07 MED ORDER — HYDRALAZINE HCL 20 MG/ML IJ SOLN
10.0000 mg | Freq: Once | INTRAMUSCULAR | Status: AC
  Administered 2020-03-07: 10 mg via INTRAVENOUS

## 2020-03-07 NOTE — Progress Notes (Signed)
Pharmacy Electrolyte Monitoring Consult:  Pharmacy consulted to assist in monitoring and replacing electrolytes in this 69 y.o. female admitted on 03/03/2020 with Altered Mental Status and Weakness  Patient with MRI showing acute left internal capsular and subinsular infarcts as well as chronic microhemorrhages. Further decline in neurological status 7/1 am with CTH negative for hemorrhagic transformation but with edema in the area of the left internal capsular infarct. Patient with significantly elevated BP on arrival, has since improved and now off nicardipine drip.  Labs:  Sodium (mmol/L)  Date Value  03/07/2020 138  12/13/2011 129 (L)   Potassium (mmol/L)  Date Value  03/07/2020 4.9  12/13/2011 3.5   Magnesium (mg/dL)  Date Value  03/05/2020 1.7  12/13/2011 2.2   Phosphorus (mg/dL)  Date Value  03/05/2020 3.6   Calcium (mg/dL)  Date Value  03/07/2020 9.3   Calcium, Total (mg/dL)  Date Value  12/13/2011 8.9   Albumin (g/dL)  Date Value  03/03/2020 4.2  12/11/2011 2.4 (L)    Assessment/Plan: Current electrolytes WNL. Potassium jumped from 3.9 > 4.9. Unknown reason. Continue to monitor.  Defer frequency of monitoring to primary MD. Will continue to follow along as needed.  Eleonore Chiquito, PharmD, BCPS Clinical Pharmacist 03/07/2020 7:36 AM

## 2020-03-07 NOTE — Progress Notes (Addendum)
Patient Agitated and combative throughout shift. Patient refuses to take any medication by mouth. AxOx2. Nih of 8-9.PRN hydralazine given throughout night along with an extra 10mg  dose of hydralazine. Provider aware of high blood pressure. Patient given ativan for agitation. Patients arms have strong pulses, normal temperature, right arm is +1 edema. No edema  In left arm. Arms are bilaterally red below the elbows. Provider aware of redness. Purewick more successful tonight.

## 2020-03-07 NOTE — Plan of Care (Signed)

## 2020-03-07 NOTE — Progress Notes (Signed)
Inpatient Rehab Admissions Coordinator:   Met with this Pt. At bedside and spoke with her brother, Racheal Mathurin and Sister, Milinda Hirschfeld over the phone. Pt. Will likely need long-term  24/7 support following discharge from CIR due to significant cognitive deficts. [facility] and Deb state that they are unable to provide that level of support and would be interested in short term rehab in a SNF with potential for long-term care placement. I will not pursue CIR admit for this patient and will sign off at this time.   Clemens Catholic, [place], [place] Admissions Coordinator  [phone] ([place]) [phone] (office)

## 2020-03-07 NOTE — Progress Notes (Signed)
PROGRESS NOTE  Madeline Park IFO:[id number] DOB: 1951-08-18 DOA: 03/03/2020 PCP: Tonia Ghent, MD  Brief History   69 year old lady with past medical history of hypertension, stage IV colon cancer with perforation, who presents with acute stroke, hypertensive emergency, AKI.  Neurology is on board.  Palliative care is consulted.  [facility] were consulted to admit the patient for further evaluation and treatment. Neurology has recommended ASA 325 and statin only. She is off of the nicardipine drip used to control her pressures although they remain somewhat elevated.  The patient has been evaluated by PT/OT. Recommendation was for CIR, but the patient has been evaluated by CIR and it has been determined that a more appropriate disposition would be to SNF for rehab and then to permanent placement. Consultants  . Neurology  Procedures  . None  Antibiotics   Anti-infectives (From admission, onward)   None     Subjective  The patient is awake and alert. No new complaints.  Objective   Vitals:  Vitals:   03/07/20 1102 03/07/20 1200  BP: (!) 163/93 (!) 181/95  Pulse:  (!) 107  Resp:  (!) 23  Temp:  (!) 97.1 F (36.2 C)  SpO2:  97%    Exam:  Constitutional:  . The patient is awake, alert, but a little confused. No acute distress. Respiratory:  . No increased work of breathing. . No wheezes, rales, or rhonchi . No tactile fremitus Cardiovascular:  . Regular rate and rhythm . No murmurs, ectopy, or gallups. . No lateral PMI. No thrills. Abdomen:  . Abdomen is soft, non-tender, non-distended . No hernias, masses, or organomegaly . Normoactive bowel sounds.  Musculoskeletal:  . No cyanosis, clubbing, or edema Skin:  . No rashes, lesions, ulcers . palpation of skin: no induration or nodules Neurologic:  . CN 2-12 intact except for rt facial droop . Sensation all 4 extremities intact . Motor symptoms are improving. Psychiatric:  . Mental status:  Stable  I have personally reviewed the following:   Today's Data  . Vitals, BMP, CBC  Scheduled Meds: . amLODipine  10 mg Oral Daily  . aspirin  325 mg Oral Daily  . Chlorhexidine Gluconate Cloth  6 each Topical Daily  . hydrALAZINE  25 mg Oral Q6H  . simvastatin  20 mg Oral q1800   Continuous Infusions: . sodium chloride 75 mL/hr at 03/07/20 1200   IV NS 125 cc/hr  No problems updated.  LOS: 3 days   A & P  Acute CVA of left internal capsular and subinsular infarcts: The patient has been evaluated by neurology. She has been started on ASA 325 mg daily with simvastatin.   Hypertensive emergency: The patient is off of nicardipine drip. Will keep systolic blood pressure below 220, and slowly bring them down to a normal range. I have started the patient on diltiazem. She has a stated sensitivity to metoprolol  Hyperlipidemia: LDL is 78. Goal LSL is below 70. Start simvastatin 20 mg daily.  AKI: On admission creatinine was 1.11. It appears that that is likely her baseline. On the morning of 03/05/2020 it has increased to 2.20. After starting IV fluids it is down to 1.12. Will continue to avoid nephrotoxins, and hypotension. Will monitor creatinine, electrolytes, and volume status. Will reduce rate of IV fluids to 50 cc/hr.  Vitamin B 12 deficiency: Noted. Supplement  History of rectal cancer Stage IV with perf and peritonitis in 2003. Noted.   I have seen and examined this patient myself.  I have spent 30 minutes in her evaluation and care.  DVT Prophylaxis: SCD's CODE STATUS: DNR Family Communication: None available Disposition: Patient is from home. Anticipate discharge to rehab Barriers to discharge. Control of blood pressure and SNF placement.   W[place], DO [facility] Direct contact: see [email or web address]  7PM-7AM contact night coverage as above 03/07/2020, 12:21 PM  LOS: 1 day

## 2020-03-07 NOTE — Progress Notes (Signed)
Physical Therapy Treatment Patient Details Name: Madeline Park MRN: [id number] DOB: 03-15-1951 Today's Date: 03/07/2020    History of Present Illness Per MD notes: Pt is a 69 yo female with a PMH of Skin Cancer, Peritonitis, HTN, Metastatic Rectal Cancer to the Liver (dx 11/2002 s/p Hartmann and LAR with Ileostomy), and B12 Deficiency.  Pt presented to the ED with slurred speech and frequent falls.  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    PT Comments    Patient with improved integration of R UE into functional tasks this date; continues to require min cuing for R visual scanning with gait efforts.  Min/mod assist for gait efforts; consistent manual facilitation for L ant/lateral weight shift to maximize gait mechanics, midline and overall fluidity. Participated well with visual/cognitive task in unsupported standing; step by step cuing required for task sequencing and recall; cga/min assist for standing balance.      Follow Up Recommendations  CIR     Equipment Recommendations  Rolling walker with 5" wheels    Recommendations for Other Services       Precautions / Restrictions Precautions Precautions: Fall Restrictions Weight Bearing Restrictions: No    Mobility  Bed Mobility Overal bed mobility: Needs Assistance Bed Mobility: Supine to Sit     Supine to sit: Supervision     General bed mobility comments: able to complete (exit towards L) without physical assist this date  Transfers Overall transfer level: Needs assistance Equipment used: 1 person hand held assist Transfers: Sit to/from Stand Sit to Stand: Min assist         General transfer comment: min cuing for R LE position and protection with movement transition  Ambulation/Gait Ambulation/Gait assistance: Min assist;Mod assist Gait Distance (Feet): 150 Feet Assistive device: None       General  Gait Details: reciprocal stepping pattern, inconsistent R > L foot placement; min/mod manual facilitation for L ant/lateral weight shift to optimize R LE advancement and control throughout gait cycle.  Consistent cuing for R visual scanning/awareness.   Stairs             Wheelchair Mobility    Modified Rankin (Stroke Patients Only)       Balance Overall balance assessment: Needs assistance Sitting-balance support: No upper extremity supported;Feet supported Sitting balance-Leahy Scale: Good     Standing balance support: Bilateral upper extremity supported Standing balance-Leahy Scale: Poor                              Cognition Arousal/Alertness: Awake/alert Behavior During Therapy: Flat affect                                   General Comments: Alert and oriented to self only; generally fidgety with items in bed.  Follows simple commands, increased time for processing, response and initiation      Exercises Other Exercises Other Exercises: Able to don socks to bilat feet in semi-recumbent position; fair/good integration of R UE into functional task (improved from previous session).  Mild increase in time/attention for fine motor use of R UE. Other Exercises: Unsupported standing balance at elevated tray table, cga/min assist. Participated with visual/cognitive task requiring patient to scan environment to locate letters of alphabet, verbalize animal starting with specific letter and use R UE (modified D2 extension) reaching  for movement of item. Step by step cuing required for task sequencing and recall; able to follow task and alphabetize letters wth 75% accuracy, identify appropriate animal 25% time.    General Comments        Pertinent Vitals/Pain Pain Assessment: No/denies pain    Home Living                      Prior Function            PT Goals (current goals can now be found in the care plan section) Acute Rehab PT  Goals Patient Stated Goal: none stated PT Goal Formulation: With patient Time For Goal Achievement: 03/18/20 Potential to Achieve Goals: Good Progress towards PT goals: Progressing toward goals    Frequency    7X/week      PT Plan Current plan remains appropriate    Co-evaluation              AM-PAC PT "6 Clicks" Mobility   Outcome Measure  Help needed turning from your back to your side while in a flat bed without using bedrails?: None Help needed moving from lying on your back to sitting on the side of a flat bed without using bedrails?: None Help needed moving to and from a bed to a chair (including a wheelchair)?: A Little Help needed standing up from a chair using your arms (e.g., wheelchair or bedside chair)?: A Little Help needed to walk in hospital room?: A Lot Help needed climbing 3-5 steps with a railing? : A Lot 6 Click Score: 18    End of Session Equipment Utilized During Treatment: Gait belt Activity Tolerance: Patient tolerated treatment well Patient left: in chair;with call bell/phone within reach (RN to place chair alarm and box (not currently available on unit)) Nurse Communication: Mobility status PT Visit Diagnosis: Unsteadiness on feet (R26.81);Repeated falls (R29.6);Muscle weakness (generalized) (M62.81);Ataxic gait (R26.0);Hemiplegia and hemiparesis Hemiplegia - Right/Left: Right Hemiplegia - dominant/non-dominant: Dominant Hemiplegia - caused by: Cerebral infarction     Time: 9323-5573 PT Time Calculation (min) (ACUTE ONLY): 32 min  Charges:  $Gait Training: 8-22 mins $Neuromuscular Re-education: 8-22 mins                     Monserath Neff H. Owens Shark, PT, DPT, NCS 03/07/20, 3:17 PM [phone]

## 2020-03-08 LAB — BASIC METABOLIC PANEL
Anion gap: 8 (ref 5–15)
BUN: 22 mg/dL (ref 8–23)
CO2: 23 mmol/L (ref 22–32)
Calcium: 9.1 mg/dL (ref 8.9–10.3)
Chloride: 105 mmol/L (ref 98–111)
Creatinine, Ser: 1.08 mg/dL — ABNORMAL HIGH (ref 0.44–1.00)
GFR calc Af Amer: >60 mL/min (ref >60)
GFR calc non Af Amer: 53 mL/min — ABNORMAL LOW (ref >60)
Glucose, Bld: 98 mg/dL (ref 70–99)
Potassium: 3.9 mmol/L (ref 3.5–5.1)
Sodium: 136 mmol/L (ref 135–145)

## 2020-03-08 LAB — CBC
HCT: 42.3 % (ref 36.0–46.0)
Hemoglobin: 13.9 g/dL (ref 12.0–15.0)
MCH: 28.1 pg (ref 26.0–34.0)
MCHC: 32.9 g/dL (ref 30.0–36.0)
MCV: 85.5 fL (ref 80.0–100.0)
Platelets: 290 10*3/uL (ref 150–400)
RBC: 4.95 MIL/uL (ref 3.87–5.11)
RDW: 13.8 % (ref 11.5–15.5)
WBC: 7.5 10*3/uL (ref 4.0–10.5)
nRBC: 0 % (ref 0.0–0.2)

## 2020-03-08 MED ORDER — DILTIAZEM HCL 30 MG PO TABS
60.0000 mg | ORAL_TABLET | Freq: Four times a day (QID) | ORAL | Status: DC
  Administered 2020-03-08 – 2020-03-11 (×10): 60 mg via ORAL
  Filled 2020-03-08 (×10): qty 2

## 2020-03-08 NOTE — Progress Notes (Addendum)
Cross Cover Brief Note Patient remains off nicardipine drip and blood pressures still slightly high but within goal for her and noted improvement slowly with addition of oral meds.  She can be transferred to [facility] with continuous cardiac monitoring unit to continue her plan of care.

## 2020-03-08 NOTE — Progress Notes (Signed)
PT Cancellation Note  Patient Details Name: Madeline Park MRN: [id number] DOB: May 16, 1951   Cancelled Treatment:    Reason Eval/Treat Not Completed: Other (comment)   Pt awake upon arrival.  Pt closes her eyes upon introducing myself.  She grunts but makes no further attempt to answer any questions and keeps eyes gently closed.  Session deferred as she is not interacting with attempts/education.    Chesley Noon 03/08/2020, 1:17 PM

## 2020-03-08 NOTE — TOC Progression Note (Addendum)
Transition of Care [facility]) - Progression Note    Patient Details  Name: Madeline Park MRN: [id number] Date of Birth: 07/31/1951  Transition of Care [facility]) CM/SW [place], RN Phone Number: 03/08/2020, 12:43 PM  Clinical Narrative:   Spoke with the brother Shanon Brow, they give permission to do a bed search, he said that she has been declining for months. They have hauled of numerous bags of trash out of her house, Her dogs have been boarded and they are attempted to find placement for them. They are looking at long term placement after short term. We will review bed offers once obtained, the patient has had covid vaccinations, FL2, PASSR and bed search done         Expected Discharge Plan and Services                                                 Social Determinants of Health (SDOH) Interventions    Readmission Risk Interventions No flowsheet data found.

## 2020-03-08 NOTE — NC FL2 (Signed)
[place] LEVEL OF CARE SCREENING TOOL     IDENTIFICATION  Patient Name: Madeline Park Birthdate: [birth year] Sex: female Admission Date (Current Location): 03/03/2020  [facility] and [place] Number:  Engineering geologist and Address:  [facility], [address], [place], [place] [address]      Provider Number: [id number]  Attending Physician Name and Address:  Karie Kirks, DO  Relative Name and Phone Number:  Suzi Roots sister [phone]    Current Level of Care: Hospital Recommended Level of Care: [place] Prior Approval Number:    Date Approved/Denied:   PASRR Number: [id number]  Discharge Plan: SNF    Current Diagnoses: Patient Active Problem List   Diagnosis Date Noted   Hyperlipidemia 03/05/2020   Stroke ([place]) 03/04/2020   Vitamin D deficiency 01/19/2019   Healthcare maintenance 10/18/2016   Osteopenia 10/29/2015   Advance care planning 08/05/2014   Short gut syndrome 08/05/2014   Medicare annual wellness visit, subsequent 08/13/2012   Vitamin B12 deficiency 08/13/2012   AKI (acute kidney injury) ([place]) 10/29/2011   History of colon cancer, stage IV 06/16/2008   Essential hypertension 12/11/2007    Orientation RESPIRATION BLADDER Height & Weight     Self  Normal Incontinent Weight: 66.1 kg Height:  5\' 7"  (170.2 cm)  BEHAVIORAL SYMPTOMS/MOOD NEUROLOGICAL BOWEL NUTRITION STATUS  Other (Comment) (figets, confused)   Incontinent Diet (2 gram sodium)  AMBULATORY STATUS COMMUNICATION OF NEEDS Skin   Extensive Assist Verbally Normal                       Personal Care Assistance Level of Assistance  Bathing, Dressing Bathing Assistance: Limited assistance   Dressing Assistance: Limited assistance     Functional Limitations Info             SPECIAL CARE FACTORS FREQUENCY  PT (By licensed PT), OT (By licensed OT)     PT Frequency: 5 times per week OT Frequency: 5 times per week             Contractures Contractures Info: Not present    Additional Factors Info  Code Status, Allergies Code Status Info: DNR Allergies Info: Ivp Dye , Promethazine Hcl, Imodium Metoprolol, Molasses, Propofol , Lisinopril           Current Medications (03/08/2020):  This is the current hospital active medication list Current Facility-Administered Medications  Medication Dose Route Frequency Provider Last Rate Last Admin   0.9 %  sodium chloride infusion   Intravenous Continuous Swayze, Ava, DO 50 mL/hr at 03/07/20 2158 New Bag at 03/07/20 2158   ALPRAZolam (XANAX) tablet 0.25 mg  0.25 mg Oral BID PRN Sharion Settler, NP   0.25 mg at 03/07/20 0930   aspirin tablet 325 mg  325 mg Oral Daily Swayze, Ava, DO   325 mg at 03/08/20 7628   Chlorhexidine Gluconate Cloth 2 % PADS 6 each  6 each Topical Daily Flora Lipps, MD   6 each at 03/07/20 1325   diltiazem (CARDIZEM) tablet 30 mg  30 mg Oral Q6H Swayze, Ava, DO   30 mg at 03/08/20 1233   docusate sodium (COLACE) capsule 100 mg  100 mg Oral BID PRN Awilda Bill, NP       hydrALAZINE (APRESOLINE) injection 10-20 mg  10-20 mg Intravenous Q4H PRN Awilda Bill, NP   20 mg at 03/08/20 0020   hydrALAZINE (APRESOLINE) tablet 25 mg  25 mg Oral Q6H Swayze,  Ava, DO   25 mg at 03/08/20 1233   polyethylene glycol (MIRALAX / GLYCOLAX) packet 17 g  17 g Oral Daily PRN Awilda Bill, NP       simvastatin (ZOCOR) tablet 20 mg  20 mg Oral q1800 Swayze, Ava, DO   20 mg at 03/07/20 1818     Discharge Medications: Please see discharge summary for a list of discharge medications.  Relevant Imaging Results:  Relevant Lab Results:   Additional Information SS# [id number]  Su Hilt, RN

## 2020-03-08 NOTE — Progress Notes (Signed)
PROGRESS NOTE  Madeline Park OQH:[id number] DOB: May 12, 1951 DOA: 03/03/2020 PCP: Tonia Ghent, MD  Brief History   69 year old lady with past medical history of hypertension, stage IV colon cancer with perforation, who presents with acute stroke, hypertensive emergency, AKI.  Neurology is on board.  Palliative care is consulted.  [facility] were consulted to admit the patient for further evaluation and treatment. Neurology has recommended ASA 325 and statin only. She is off of the nicardipine drip used to control her pressures although they remain somewhat elevated.  The patient has been evaluated by PT/OT. Recommendation was for CIR, but the patient has been evaluated by CIR and it has been determined that a more appropriate disposition would be to SNF for rehab and then to permanent placement. Consultants   Neurology  Procedures   None  Antibiotics   Anti-infectives (From admission, onward)   None     Subjective  The patient is awake and alert. No new complaints.  Objective   Vitals:  Vitals:   03/08/20 1156 03/08/20 1555  BP: (!) 168/75 (!) 168/93  Pulse: 94 87  Resp: 16   Temp: 97.6 F (36.4 C) 98.1 F (36.7 C)  SpO2: 99% 98%   Exam:  Constitutional:   The patient is awake, alert, but a little confused. No acute distress. Respiratory:   No increased work of breathing.  No wheezes, rales, or rhonchi  No tactile fremitus Cardiovascular:   Regular rate and rhythm  No murmurs, ectopy, or gallups.  No lateral PMI. No thrills. Abdomen:   Abdomen is soft, non-tender, non-distended  No hernias, masses, or organomegaly  Normoactive bowel sounds.  Musculoskeletal:   No cyanosis, clubbing, or edema Skin:   No rashes, lesions, ulcers  palpation of skin: no induration or nodules Neurologic:   CN 2-12 intact except for rt facial droop  Sensation all 4 extremities intact  Motor symptoms are improving. Psychiatric:   Mental  status: Stable  I have personally reviewed the following:   Today's Data   Vitals, BMP, CBC  Scheduled Meds:  aspirin  325 mg Oral Daily   Chlorhexidine Gluconate Cloth  6 each Topical Daily   diltiazem  30 mg Oral Q6H   hydrALAZINE  25 mg Oral Q6H   simvastatin  20 mg Oral q1800   Continuous Infusions:  sodium chloride 50 mL/hr at 03/07/20 2158   IV NS 125 cc/hr  No problems updated.  LOS: 4 days   A & P  Acute CVA of left internal capsular and subinsular infarcts: The patient has been evaluated by neurology. She has been started on ASA 325 mg daily with simvastatin.   Hypertensive emergency: The patient is off of nicardipine drip. Will keep systolic blood pressure below 220, and slowly bring them down to a normal range. I have started the patient on diltiazem. Will increase the dose today. She has a stated sensitivity to metoprolol.  Hyperlipidemia: LDL is 78. Goal LSL is below 70. Start simvastatin 20 mg daily.  AKI: On admission creatinine was 1.11. It appears that that is likely her baseline. On the morning of 03/05/2020 it has increased to 2.20. After starting IV fluids it is down to 1.08. Will continue to avoid nephrotoxins, and hypotension. Will monitor creatinine, electrolytes, and volume status. Will reduce rate of IV fluids to 50 cc/hr.  Vitamin B 12 deficiency: Noted. Supplement  History of rectal cancer Stage IV with perf and peritonitis in 2003. Noted.   I have seen and  examined this patient myself. I have spent 30 minutes in her evaluation and care.  DVT Prophylaxis: SCD's CODE STATUS: DNR Family Communication: None available Disposition: Patient is from home. Anticipate discharge to rehab Barriers to discharge. Control of blood pressure and SNF placement.  Paddy Walthall, DO [facility] Direct contact: see [email or web address]  7PM-7AM contact night coverage as above 03/08/2020, 6:59 PM  LOS: 1 day

## 2020-03-09 MED ORDER — HYDRALAZINE HCL 50 MG PO TABS
50.0000 mg | ORAL_TABLET | Freq: Four times a day (QID) | ORAL | Status: DC
  Administered 2020-03-09 – 2020-03-10 (×3): 50 mg via ORAL
  Filled 2020-03-09 (×3): qty 1

## 2020-03-09 NOTE — TOC Progression Note (Signed)
Transition of Care [facility]) - Progression Note    Patient Details  Name: Madeline Park MRN: [id number] Date of Birth: 29-Jul-1951  Transition of Care [facility]) CM/SW [place], RN Phone Number: 03/09/2020, 11:01 AM  Clinical Narrative:     Spoke with the patient's brother Madeline Park on the phone and reviewed the bed offers, he stated that his sister Madeline Park is trying to get POA, I explained if a patient has any confusion they can not legally sign papers to give someone power of attorney, he stated they do not have living family other than the siblings, the patient never married and has no children, The brother supplied me with the phone number for Deb, I updated the system, He asked that I send bed search to the [employer], I sent the bed search to the [place] area in the system       Expected Discharge Plan and Services                                                 Social Determinants of Health (SDOH) Interventions    Readmission Risk Interventions No flowsheet data found.

## 2020-03-09 NOTE — Progress Notes (Signed)
Physical Therapy Treatment Patient Details Name: Madeline Park MRN: [id number] DOB: 05/31/1951 Today's Date: 03/09/2020    History of Present Illness Per MD notes: Pt is a 69 yo female with a PMH of Skin Cancer, Peritonitis, HTN, Metastatic Rectal Cancer to the Liver (dx 11/2002 s/p Hartmann and LAR with Ileostomy), and B12 Deficiency.  Pt presented to the ED with slurred speech and frequent falls.  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    PT Comments    Pt was seated in recliner upon arriving. She greets therapist and is cooperative and pleasant throughout. Pt is confused and disoriented to situation/time. She is oriented to self and able to follow commands consistently. Slightly impulsive at times. She was able to stand to RW with CGA + moderate vcs for improved technique/safety. Ambulated 1 lap with RW and then 1 lap without AD (RUE HHA). She continues to be high fall risk with dynamic standing activity. Pt tends to have scissoring/narrow BOS with staggering at times. Less neglect noted today but continues to be present. Pt will benefit from continued skilled PT at DC to address deficits with strength, balance, and safe functional mobility.     Follow Up Recommendations  CIR;SNF     Equipment Recommendations  Rolling walker with 5" wheels    Recommendations for Other Services       Precautions / Restrictions Precautions Precautions: Fall Restrictions Weight Bearing Restrictions: No    Mobility  Bed Mobility               General bed mobility comments: pt was seated in recliner pre/post session.  Transfers Overall transfer level: Needs assistance Equipment used: Rolling walker (2 wheeled) Transfers: Sit to/from Stand Sit to Stand: Min guard         General transfer comment: CGA to STS from recliner to recliner. Vcs for handplacement, fwd wt shift, and overall  improved safety.  Ambulation/Gait Ambulation/Gait assistance: Min guard;Min assist Gait Distance (Feet): 160 Feet Assistive device: 1 person hand held assist;Rolling walker (2 wheeled) Gait Pattern/deviations: Scissoring;Staggering left;Staggering right;Drifts right/left;Trunk flexed;Narrow base of support;Step-through pattern Gait velocity: decreased   General Gait Details: pt ambulated 160 ft with RW with CGA + vcs for posture correction and increased BOS. Pt continues to have scissoring/narrow BOS with unsteadiness noted at time. She also ambulated with +1 RUE HHA 160 ft with continued deficits. Pt is easily distracted in hallway and required vcs for focus on desired task.    Stairs             Wheelchair Mobility    Modified Rankin (Stroke Patients Only)       Balance Overall balance assessment: Needs assistance Sitting-balance support: No upper extremity supported;Feet supported Sitting balance-Leahy Scale: Good Sitting balance - Comments: no LOB in sitting   Standing balance support: Bilateral upper extremity supported;Single extremity supported Standing balance-Leahy Scale: Poor Standing balance comment: pt has severe dynamic balance deficits but static balance is fair.                            Cognition Arousal/Alertness: Awake/alert Behavior During Therapy: WFL for tasks assessed/performed Overall Cognitive Status: No family/caregiver present to determine baseline cognitive functioning Area of Impairment: Safety/judgement;Orientation;Following commands;Memory;Attention                 Orientation Level: Disoriented to;Situation;Time  General Comments: Pt greets therapist upon arriving. He is oriented to self and location but disoriented to time and situation      Exercises      General Comments        Pertinent Vitals/Pain Pain Assessment: No/denies pain    Home Living                      Prior  Function            PT Goals (current goals can now be found in the care plan section) Acute Rehab PT Goals Patient Stated Goal: none stated Progress towards PT goals: Progressing toward goals    Frequency    7X/week      PT Plan Current plan remains appropriate    Co-evaluation              AM-PAC PT "6 Clicks" Mobility   Outcome Measure  Help needed turning from your back to your side while in a flat bed without using bedrails?: None Help needed moving from lying on your back to sitting on the side of a flat bed without using bedrails?: None Help needed moving to and from a bed to a chair (including a wheelchair)?: A Little Help needed standing up from a chair using your arms (e.g., wheelchair or bedside chair)?: A Little Help needed to walk in hospital room?: A Lot Help needed climbing 3-5 steps with a railing? : A Lot 6 Click Score: 18    End of Session Equipment Utilized During Treatment: Gait belt Activity Tolerance: Patient tolerated treatment well Patient left: in chair;with call bell/phone within reach Nurse Communication: Mobility status PT Visit Diagnosis: Unsteadiness on feet (R26.81);Repeated falls (R29.6);Muscle weakness (generalized) (M62.81);Ataxic gait (R26.0);Hemiplegia and hemiparesis Hemiplegia - Right/Left: Right Hemiplegia - dominant/non-dominant: Dominant Hemiplegia - caused by: Cerebral infarction     Time: 3335-4562 PT Time Calculation (min) (ACUTE ONLY): 16 min  Charges:  $Gait Training: 8-22 mins                     Julaine Fusi PTA 03/09/20, 12:46 PM

## 2020-03-09 NOTE — TOC Progression Note (Signed)
Transition of Care [facility]) - Progression Note    Patient Details  Name: Madeline Park MRN: [id number] Date of Birth: [birth year]  Transition of Care [facility]) CM/SW [place], RN Phone Number: 03/09/2020, 2:16 PM  Clinical Narrative:    I called Olivia Mackie at [facility] in [place] and asked that they take a look at the bed request and give me a call if they can accept the patient        Expected Discharge Plan and Services                                                 Social Determinants of Health (SDOH) Interventions    Readmission Risk Interventions No flowsheet data found.

## 2020-03-09 NOTE — TOC Progression Note (Signed)
Transition of Care [facility]) - Progression Note    Patient Details  Name: Madeline Park MRN: [id number] Date of Birth: Jun 14, 1951  Transition of Care [facility]) CM/SW [place], RN Phone Number: 03/09/2020, 4:02 PM  Clinical Narrative:    Spoke to the patient's brother and sister in law They would like the patient to go to [place] or [place] to Short term rehab, the patient has had her covid vaccines, I faxed the bed request to [facility] in [place], The sister in law will look up facilities that they want the patient to go to and get the fax number for me to fax the bed request to and she will call me first thing in the morning with the numbers,, I explained I also have to get insurance approval and will need to know where she is going ASAP, they stated understanding        Expected Discharge Plan and Services                                                 Social Determinants of Health (SDOH) Interventions    Readmission Risk Interventions No flowsheet data found.

## 2020-03-09 NOTE — Progress Notes (Signed)
PROGRESS NOTE  Madeline Park JME:[id number] DOB: 1950/10/03 DOA: 03/03/2020 PCP: Tonia Ghent, MD  Brief History   69 year old lady with past medical history of hypertension, stage IV colon cancer with perforation, who presents with acute stroke, hypertensive emergency, AKI.  Neurology is on board.  Palliative care is consulted.  [facility] were consulted to admit the patient for further evaluation and treatment. Neurology has recommended ASA 325 and statin only. She is off of the nicardipine drip used to control her pressures although they remain somewhat elevated.  The patient has been evaluated by PT/OT. Recommendation was for CIR, but the patient has been evaluated by CIR and it has been determined that a more appropriate disposition would be to SNF for rehab and then to permanent placement. Consultants   Neurology  Procedures   None  Antibiotics   Anti-infectives (From admission, onward)   None     Subjective  The patient is awake and alert. No new complaints.  Objective   Vitals:  Vitals:   03/09/20 1145 03/09/20 1604  BP: (!) 147/84 (!) 150/88  Pulse: 86 86  Resp: 18 17  Temp: 97.8 F (36.6 C) 97.9 F (36.6 C)  SpO2: 100% 100%   Exam:  Constitutional:   The patient is awake, alert, but a little confused. No acute distress. Respiratory:   No increased work of breathing.  No wheezes, rales, or rhonchi  No tactile fremitus Cardiovascular:   Regular rate and rhythm  No murmurs, ectopy, or gallups.  No lateral PMI. No thrills. Abdomen:   Abdomen is soft, non-tender, non-distended  No hernias, masses, or organomegaly  Normoactive bowel sounds.  Musculoskeletal:   No cyanosis, clubbing, or edema Skin:   No rashes, lesions, ulcers  palpation of skin: no induration or nodules Neurologic:   CN 2-12 intact except for rt facial droop  Sensation all 4 extremities intact  Motor symptoms are improving. Psychiatric:   Mental  status: Stable  I have personally reviewed the following:   Today's Data   Vitals, BMP, CBC  Scheduled Meds:  aspirin  325 mg Oral Daily   Chlorhexidine Gluconate Cloth  6 each Topical Daily   diltiazem  60 mg Oral Q6H   hydrALAZINE  25 mg Oral Q6H   simvastatin  20 mg Oral q1800   Continuous Infusions:  IV NS 125 cc/hr  No problems updated.  LOS: 5 days   A & P  Acute CVA of left internal capsular and subinsular infarcts: The patient has been evaluated by neurology. She has been started on ASA 325 mg daily with simvastatin.   Hypertensive emergency: The patient is off of nicardipine drip. Will keep systolic blood pressure below 220, and slowly bring them down to a normal range. I have started the patient on diltiazem 60 mg and hydralazine 50 mg q6 hours. She has a stated sensitivity to metoprolol.  Hyperlipidemia: LDL is 78. Goal LSL is below 70. Start simvastatin 20 mg daily.  AKI: On admission creatinine was 1.11. It appears that that is likely her baseline. On the morning of 03/05/2020 it has increased to 2.20. After starting IV fluids it is down to 1.08. Will continue to avoid nephrotoxins, and hypotension. Will monitor creatinine, electrolytes, and volume status. Will reduce rate of IV fluids to 50 cc/hr.  Vitamin B 12 deficiency: Noted. Supplement  History of rectal cancer Stage IV with perf and peritonitis in 2003. Noted.   I have seen and examined this patient myself. I have  spent 34 minutes in her evaluation and care.  DVT Prophylaxis: SCD's CODE STATUS: DNR Family Communication: I have discussed the patient with her brother Waunita Schooner and her sister. All questions answered to the best of my ability. Disposition: Patient is from home. Anticipate discharge to rehab Barriers to discharge. Control of blood pressure and SNF placement.  Jalacia Mattila, DO [facility] Direct contact: see [email or web address]  7PM-7AM contact night coverage as above 03/09/2020, 4:16 PM  LOS: 1  day

## 2020-03-09 NOTE — Progress Notes (Signed)
Occupational Therapy Treatment Patient Details Name: Madeline Park MRN: [id number] DOB: [birth year] Today's Date: 03/09/2020    History of present illness Per MD notes: Pt is a 69 yo female with a PMH of Skin Cancer, Peritonitis, HTN, Metastatic Rectal Cancer to the Liver (dx 11/2002 s/p Hartmann and LAR with Ileostomy), and B12 Deficiency.  Pt presented to the ED with slurred speech and frequent falls.  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   OT comments  Pt presents sitting up in recliner, agreeable to treatment, recognizing this therapist from previous session. She continues to present with decreased [place] of RUE and difficulty sequencing multi-step seated tasks. When presented with tray of grooming items while seated, pt demonstrates only minimal R side neglect. She follows one step commands well ("pick up the washcloth"), but requires Max verbal and visual cues for sequencing multi-step grooming tasks (brushing teeth). Min A and tactile cues required to bring pt's RUE up to mouth/face to complete grooming. Pt able to don/doff both socks while seated without physical assist or verbal cues. Upon standing, pt stated "I need to use the bathroom" and began to spontaneously urinate. Completed stand pivot to [facility] with Supervision and required Min A for pericare and Max A to change gown. Pt will continue to benefit from skilled acute OT services to address limitations (see below for more details) impacting pt functional participation in ADL. Continue to recommend CIR as most appropriate discharge option    Follow Up Recommendations  CIR;SNF    Equipment Recommendations  Other (comment) (TBD at next venue)    Recommendations for Other Services      Precautions / Restrictions Precautions Precautions: Fall Restrictions Weight Bearing Restrictions: No       Mobility Bed Mobility                General bed mobility comments: pt was seated in recliner pre/post session.  Transfers Overall transfer level: Needs assistance Equipment used: Rolling walker (2 wheeled) Transfers: Sit to/from Stand Sit to Stand: Supervision         General transfer comment: Supervision for stand pivot transfer from recliner to [facility] without cues    Balance Overall balance assessment: Needs assistance Sitting-balance support: No upper extremity supported;Feet supported Sitting balance-Leahy Scale: Normal Sitting balance - Comments: excellent dynamic reaching to both sides with no LOB during donning/doffing socks   Standing balance support: No upper extremity supported;During functional activity Standing balance-Leahy Scale: Fair Standing balance comment: maintains steady static balance in standing                           ADL either performed or assessed with clinical judgement   ADL Overall ADL's : Needs assistance/impaired     Grooming: Wash/dry face;Oral care;Sitting;Minimal assistance;Set up;Cueing for sequencing Grooming Details (indicate cue type and reason): required Max cues (first verbal, then visual) to initiate sequencing of brushing teeth and identification of tooth paste; Min A and tactile cues for RUE required to complete oral care; improved initiation for washing face with decreased cues; Min A for RUE required                 Toilet Transfer: Supervision/safety;Stand-pivot;BSC   Toileting- Clothing Manipulation and Hygiene: Minimal assistance;Cueing for sequencing;Sit to/from stand Toileting - Clothing Manipulation Details (indicate cue type and reason): Min A for gown identification and mgt     Functional mobility during  ADLs: Cueing for sequencing;Cueing for safety;Min guard General ADL Comments: Supervision to don/doff B socks while seated with 2 verbal cues to initiate task. Max A to change gown following urination. Pt stated "I need to use the  bathroom" and spontaneously began urinating while standing; transferred easily to [facility].     Vision Baseline Vision/History: Wears glasses Wears Glasses: At all times Vision Assessment?: Vision impaired- to be further tested in functional context Additional Comments: minimal R side neglect noted during seated and standing functional tasks this date   Perception     Praxis      Cognition Arousal/Alertness: Awake/alert Behavior During Therapy: WFL for tasks assessed/performed Overall Cognitive Status: No family/caregiver present to determine baseline cognitive functioning Area of Impairment: Safety/judgement;Orientation;Following commands;Memory;Attention                 Orientation Level: Disoriented to;Situation;Time Current Attention Level: Sustained   Following Commands: Follows multi-step commands inconsistently;Follows one step commands consistently       General Comments: Pt recognizes therapist upon arriving. She has difficulties identifying and naming tube of toothpaste when presented with three hygiene products.        Exercises Other Exercises Other Exercises: Provided multimodal cues to engage RUE integration into oral care, donning doffing socks and face washing for increased attention and [place]. Pt responds well to one step commands with >75% accuracy with increased time/attention. Other Exercises: Unsupported standing balance at elevated tray table with Standby Assist during face washing Other Exercises: Facilitated cognitive sequencing of oral care and graded for pt optimal participation. Max A required for initiation and completion of multi-step tasks.   Shoulder Instructions       General Comments      Pertinent Vitals/ Pain       Pain Assessment: No/denies pain  Home Living                                          Prior Functioning/Environment              Frequency  Min 3X/week        Progress Toward Goals  OT  Goals(current goals can now be found in the care plan section)  Progress towards OT goals: Progressing toward goals  Acute Rehab OT Goals Patient Stated Goal: To use the bathroom OT Goal Formulation: With patient Time For Goal Achievement: 03/19/20 Potential to Achieve Goals: Good  Plan Discharge plan remains appropriate;Frequency remains appropriate    Co-evaluation                 AM-PAC OT "6 Clicks" Daily Activity     Outcome Measure   Help from another person eating meals?: A Little Help from another person taking care of personal grooming?: A Little Help from another person toileting, which includes using toliet, bedpan, or urinal?: A Lot Help from another person bathing (including washing, rinsing, drying)?: A Lot Help from another person to put on and taking off regular upper body clothing?: A Lot Help from another person to put on and taking off regular lower body clothing?: A Little 6 Click Score: 15    End of Session    OT Visit Diagnosis: Other abnormalities of gait and mobility (R26.89);Other symptoms and signs involving cognitive function;Cognitive communication deficit (R41.841);Muscle weakness (generalized) (M62.81);Hemiplegia and hemiparesis Symptoms and signs involving cognitive functions: Cerebral infarction Hemiplegia - Right/Left: Right Hemiplegia - dominant/non-dominant:  Dominant Hemiplegia - caused by: Cerebral infarction   Activity Tolerance Patient tolerated treatment well   Patient Left in chair;with call bell/phone within reach;with chair alarm set   Nurse Communication          Time: 3888-7579 OT Time Calculation (min): 40 min  Charges: OT General Charges $OT Visit: 1 Visit OT Treatments $Self Care/Home Management : 38-52 mins  Jerilynn Birkenhead, OTS 03/09/20, 1:20 PM

## 2020-03-09 NOTE — Progress Notes (Signed)
Could not obtain accurate BP due to pts arms twitching.  Notified RN and will recheck in one hour.

## 2020-03-10 LAB — SARS CORONAVIRUS 2 BY RT PCR (HOSPITAL ORDER, PERFORMED IN ~~LOC~~ HOSPITAL LAB): SARS Coronavirus 2: NEGATIVE

## 2020-03-10 MED ORDER — HYDRALAZINE HCL 50 MG PO TABS
100.0000 mg | ORAL_TABLET | Freq: Three times a day (TID) | ORAL | Status: DC
  Administered 2020-03-10 – 2020-03-13 (×9): 100 mg via ORAL
  Filled 2020-03-10 (×9): qty 2

## 2020-03-10 NOTE — TOC Progression Note (Addendum)
Transition of Care [facility]) - Progression Note    Patient Details  Name: JNIYAH DANTUONO MRN: [id number] Date of Birth: [birth year]  Transition of Care [facility]) CM/SW [place], RN Phone Number: 03/10/2020, 2:24 PM  Clinical Narrative:     [facility] and left a voice mail for a return call with Jacqlyn Larsen in admissions, [phone], The visitors policy is to maker appointment for visit and wear a mask, the patient has had covid vaccines, I called [facility] in [place] and spoke with Denis in admission refaxed information at [place] stated that they will review the information dn call me back ASAP, I called [facility] and reached a referral number [phone], they stated they were not able to assist with a rehab bed I called [facility] and spoke with [facility] in admissions, she stated that she will review for short term and may not have a bed. She will review and let me know          Expected Discharge Plan and Services                                                 Social Determinants of Health (SDOH) Interventions    Readmission Risk Interventions No flowsheet data found.

## 2020-03-10 NOTE — TOC Progression Note (Signed)
Transition of Care [facility]) - Progression Note    Patient Details  Name: Madeline Park MRN: [id number] Date of Birth: 30-May-1951  Transition of Care [facility]) CM/SW [place], RN Phone Number: 03/10/2020, 9:44 AM  Clinical Narrative:    Faxed out bed request to [facility] and rehab [phone] [facility] [phone] [facility] in [place] [phone] [place] [phone] Awaiting a call back with a bed offer        Expected Discharge Plan and Services                                                 Social Determinants of Health (SDOH) Interventions    Readmission Risk Interventions No flowsheet data found.

## 2020-03-10 NOTE — Progress Notes (Signed)
PROGRESS NOTE    Madeline Park  GBT:[id number] DOB: 1950-09-15 DOA: 03/03/2020 PCP: Tonia Ghent, MD    Brief Narrative:  69 year old lady with past medical history of hypertension, stage IV colon cancer with perforation, who presents with acute stroke, hypertensive emergency, AKI. Neurology is on board. Palliative care is consulted.  [facility] were consulted to admit the patient for further evaluation and treatment. Neurology has recommended ASA 325 and statin only. She is off of the nicardipine drip used to control her pressures although they remain somewhat elevated.  The patient has been evaluated by PT/OT. Recommendation was for CIR, but the patient has been evaluated by CIR and it has been determined that a more appropriate disposition would be to SNF for rehab and then to permanent placement.    Consultants:   Neurology  Procedures:   Antimicrobials:       Subjective: Patient laying in bed without any acute distress.  Has no complaints of shortness of breath, chest pain, dizziness or headaches  Objective: Vitals:   03/10/20 0754 03/10/20 1147 03/10/20 1536 03/10/20 1754  BP: (!) 161/92 (!) 167/84 (!) 158/99 (!) 176/99  Pulse: 82 84 83 95  Resp: 18 17 16    Temp: 97.7 F (36.5 C) 98.1 F (36.7 C) 97.8 F (36.6 C) 98 F (36.7 C)  TempSrc: Oral  Oral Oral  SpO2: 98% 99% 99% 98%  Weight:      Height:        Intake/Output Summary (Last 24 hours) at 03/10/2020 1831 Last data filed at 03/10/2020 1230 Gross per 24 hour  Intake 360 ml  Output --  Net 360 ml   Filed Weights   03/07/20 0500 03/08/20 0500 03/10/20 0637  Weight: 66.5 kg 66.1 kg 79.8 kg    Examination:  General exam: Appears calm and comfortable, NAD Respiratory system: Clear to auscultation. Respiratory effort normal. Cardiovascular system: S1 & S2 heard, RRR. No JVD, murmurs, rubs, gallops or clicks.  Gastrointestinal system: Abdomen is nondistended, soft and nontender.  Normal bowel sounds heard. Central nervous system: Alert and oriented x2, not to place.  Grossly intact deficits. Extremities: No edema Skin: Warm dry Psychiatry:  Mood & affect appropriate in current setting.     Data Reviewed: I have personally reviewed following labs and imaging studies  CBC: Recent Labs  Lab 03/03/20 1855 03/04/20 0520 03/05/20 0404 03/06/20 0537 03/08/20 0403  WBC 6.7 5.9 7.8 7.2 7.5  NEUTROABS 4.6 4.5  --   --   --   HGB 13.5 13.7 14.1 13.7 13.9  HCT 40.5 41.4 41.3 41.1 42.3  MCV 85.4 85.4 83.1 84.7 85.5  PLT 306 307 300 340 710   Basic Metabolic Panel: Recent Labs  Lab 03/04/20 0528 03/05/20 0404 03/06/20 0537 03/07/20 0526 03/08/20 0403  NA 138 138 137 138 136  K 3.9 3.8 3.9 4.9 3.9  CL 101 102 105 108 105  CO2 23 26 21* 20* 23  GLUCOSE 90 107* 105* 84 98  BUN 18 33* 34* 24* 22  CREATININE 1.11* 2.20* 1.41* 1.12* 1.08*  CALCIUM 9.1 9.3 9.1 9.3 9.1  MG  --  1.7  --   --   --   PHOS  --  3.6  --   --   --    GFR: Estimated Creatinine Clearance: 55.7 mL/min (A) (by C-G formula based on SCr of 1.08 mg/dL (H)). Liver Function Tests: Recent Labs  Lab 03/03/20 1855  AST 20  ALT 16  ALKPHOS 59  BILITOT 1.4*  PROT 7.1  ALBUMIN 4.2   No results for input(s): LIPASE, AMYLASE in the last 168 hours. No results for input(s): AMMONIA in the last 168 hours. Coagulation Profile: Recent Labs  Lab 03/03/20 1855  INR 1.0   Cardiac Enzymes: No results for input(s): CKTOTAL, CKMB, CKMBINDEX, TROPONINI in the last 168 hours. BNP (last 3 results) No results for input(s): PROBNP in the last 8760 hours. HbA1C: No results for input(s): HGBA1C in the last 72 hours. CBG: Recent Labs  Lab 03/04/20 0236 03/04/20 0746 03/04/20 1119 03/04/20 1539  GLUCAP 96 94 94 81   Lipid Profile: No results for input(s): CHOL, HDL, LDLCALC, TRIG, CHOLHDL, LDLDIRECT in the last 72 hours. Thyroid Function Tests: No results for input(s): TSH, T4TOTAL, FREET4,  T3FREE, THYROIDAB in the last 72 hours. Anemia Panel: No results for input(s): VITAMINB12, FOLATE, FERRITIN, TIBC, IRON, RETICCTPCT in the last 72 hours. Sepsis Labs: No results for input(s): PROCALCITON, LATICACIDVEN in the last 168 hours.  Recent Results (from the past 240 hour(s))  SARS Coronavirus 2 by RT PCR (hospital order, performed in [facility] hospital lab) Nasopharyngeal Nasopharyngeal Swab     Status: None   Collection Time: 03/04/20  2:20 AM   Specimen: Nasopharyngeal Swab  Result Value Ref Range Status   SARS Coronavirus 2 NEGATIVE NEGATIVE Final    Comment: (NOTE) SARS-CoV-2 target nucleic acids are NOT DETECTED.  The SARS-CoV-2 RNA is generally detectable in upper and lower respiratory specimens during the acute phase of infection. The lowest concentration of SARS-CoV-2 viral copies this assay can detect is 250 copies / mL. A negative result does not preclude SARS-CoV-2 infection and should not be used as the sole basis for treatment or other patient management decisions.  A negative result may occur with improper specimen collection / handling, submission of specimen other than nasopharyngeal swab, presence of viral mutation(s) within the areas targeted by this assay, and inadequate number of viral copies (<250 copies / mL). A negative result must be combined with clinical observations, patient history, and epidemiological information.  Fact Sheet for Patients:   StrictlyIdeas.no  Fact Sheet for Healthcare Providers: BankingDealers.co.za  This test is not yet approved or  cleared by the [place] FDA and has been authorized for detection and/or diagnosis of SARS-CoV-2 by FDA under an Emergency Use Authorization (EUA).  This EUA will remain in effect (meaning this test can be used) for the duration of the COVID-19 declaration under Section 564(b)(1) of the Act, 21 U.S.C. section 360bbb-3(b)(1), unless the  authorization is terminated or revoked sooner.  Performed at [facility], [address] [place], [place] [address]   MRSA PCR Screening     Status: None   Collection Time: 03/04/20  2:39 AM   Specimen: Nasopharyngeal  Result Value Ref Range Status   MRSA by PCR NEGATIVE NEGATIVE Final    Comment:        The GeneXpert MRSA Assay (FDA approved for NASAL specimens only), is one component of a comprehensive MRSA colonization surveillance program. It is not intended to diagnose MRSA infection nor to guide or monitor treatment for MRSA infections. Performed at [facility], [address], [place], [place] [address]          Radiology Studies: No results found.      Scheduled Meds: . aspirin  325 mg Oral Daily  . diltiazem  60 mg Oral Q6H  . hydrALAZINE  100 mg Oral Q8H  .  simvastatin  20 mg Oral q1800   Continuous Infusions:  Assessment & Plan:   Principal Problem:   Stroke [facility]) Active Problems:   Essential hypertension   AKI (acute kidney injury) ([place])   Vitamin B12 deficiency   Hyperlipidemia   Acute CVA of left internal capsular and subinsular infarcts: The patient has been evaluated by neurology. She has been started on ASA 325 mg daily with simvastatin.   Hypertensive emergency: The patient is off of nicardipine drip. Will keep systolic blood pressure below 220, and slowly bring them down to a normal range.  started on diltiazem 60 mg and hydralazine 50 mg q6 hours. Still has room for improvement, will increase hydralazine to 100 mg 3 times daily  she has a stated sensitivity to metoprolol.  Hyperlipidemia: LDL is 78. Goal LSL is below 70. Start simvastatin 20 mg daily.  AKI: On admission creatinine was 1.11. It appears that that is likely her baseline. On the morning of 03/05/2020 it has increased to 2.20. After starting IV fluids it is down to 1.08. Will continue to avoid nephrotoxins, and hypotension. Will monitor creatinine,  electrolytes, and volume status.  DC IV fluids  Vitamin B 12 deficiency: Noted. Supplement  History of rectal cancer Stage IV with perf and peritonitis in 2003. Noted.   DVT prophylaxis: SCD Code Status: DNR Family Communication: None at bedside Disposition Plan: SNF awaiting authorization Status is: Inpatient  Remains inpatient appropriate because:Unsafe d/c plan   Dispo: The patient is from: Home              Anticipated d/c is to: SNF              Anticipated d/c date is: 1 day              Patient currently is not medically stable to d/c.  Awaiting authorization and better BP control with testing today            LOS: 6 days   Time spent:45 min with >50% on coc    Nolberto Hanlon, MD [facility] Pager [phone]  If 7PM-7AM, please contact night-coverage [email or web address] Password [facility] 03/10/2020, 6:31 PM

## 2020-03-10 NOTE — Care Management Important Message (Signed)
Important Message  Patient Details  Name: Madeline Park MRN: [id number] Date of Birth: June 04, 1951   Medicare Important Message Given:  Yes     Juliann Pulse A Issis Lindseth 03/10/2020, 10:09 AM

## 2020-03-10 NOTE — Progress Notes (Signed)
Physical Therapy Treatment Patient Details Name: Madeline Park MRN: [id number] DOB: Sep 08, 1950 Today's Date: 03/10/2020    History of Present Illness Per MD notes: Pt is a 69 yo female with a PMH of Skin Cancer, Peritonitis, HTN, Metastatic Rectal Cancer to the Liver (dx 11/2002 s/p Hartmann and LAR with Ileostomy), and B12 Deficiency.  Pt presented to the ED with slurred speech and frequent falls.  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    PT Comments    Pt received in bed and agreeable to PT session and to sit up in recliner chair at end of session. Pt performed visual scanning, object and word finding, dynamic reaching outside of BOS with RUE and RLE, and balance activities in varying stances for progression of balance and neuromuscular re-education. Pt with difficulty with multi-tasking having to stop one task to perform another. Pt ambulated with min A (handheld) and continues to stagger to R and have difficulty with RLE controlled placement during gait. Pt with good attitude throughout session and willing to perform all tasks. Will continue to follow and progress pt as tolerated.   Follow Up Recommendations  CIR;SNF     Equipment Recommendations  Rolling walker with 5" wheels    Recommendations for Other Services       Precautions / Restrictions Precautions Precautions: Fall Restrictions Weight Bearing Restrictions: No    Mobility  Bed Mobility Overal bed mobility: Needs Assistance Bed Mobility: Supine to Sit     Supine to sit: Supervision     General bed mobility comments: pt able to come to sitting edge of bed without physical assistance  Transfers Overall transfer level: Needs assistance Equipment used: Rolling walker (2 wheeled) Transfers: Sit to/from Stand Sit to Stand: Min guard         General transfer comment: sit to stand from bed required 2 attempts:  2nd attempt pt able to come into standing with CGA; verbal cues for hand placement  Ambulation/Gait Ambulation/Gait assistance: Min assist Gait Distance (Feet): 250 Feet Assistive device: 1 person hand held assist Gait Pattern/deviations: Step-through pattern;Scissoring;Staggering right;Narrow base of support Gait velocity: decreased   General Gait Details: pt ambulated around the nurses station with HHA/min A for steadying/balance; pt with reciprocal gait pattern, staggering R, and inconsistent placement of RLE sometimes with narrow BOS and nearly scissoring   Stairs             Wheelchair Mobility    Modified Rankin (Stroke Patients Only)       Balance Overall balance assessment: Needs assistance Sitting-balance support: Feet supported;No upper extremity supported Sitting balance-Leahy Scale: Normal Sitting balance - Comments: pt performed dynamic reaching outside of BOS in multiple directions and able to maintain balance   Standing balance support: No upper extremity supported Standing balance-Leahy Scale: Fair Standing balance comment: required CGA for static balance     Tandem Stance - Right Leg: 0 Tandem Stance - Left Leg: 4 Rhomberg - Eyes Opened: 10     High Level Balance Comments: Pt able to maintain balance x 4 seconds with L foot tandem stance and 20 seconds with narrow BOS. Pt unable to maintain balance with rhomberg eyes open requiring min A for balance. Pt noted to lose balance to the R. Verbal cues to weight shift to the L to maintain balance without physical assist however pt unable to maintain. No AD utilized.  Cognition Arousal/Alertness: Awake/alert Behavior During Therapy: WFL for tasks assessed/performed Overall Cognitive Status: No family/caregiver present to determine baseline cognitive functioning Area of Impairment: Attention;Following commands;Safety/judgement;Awareness                   Current Attention Level:  Sustained;Divided   Following Commands: Follows one step commands consistently;Follows multi-step commands inconsistently       General Comments: Pt reported knowing that she needs to call for assistance to get up out of chair.      Exercises Other Exercises Other Exercises: Pt performed dyncamic RUE and RLE targeting activity and able to reach target 100% with RUE however about 75% with RLE.  Other Exercises: Pt performed object finding in hallway during ambulation with min A for safety. Pt with difficulty multitasking and occasionally had to stop ambulating to visually scan to find an object. Pt able to visually scan to R on command and spontaneously.    General Comments        Pertinent Vitals/Pain Pain Assessment: No/denies pain    Home Living                      Prior Function            PT Goals (current goals can now be found in the care plan section) Acute Rehab PT Goals Patient Stated Goal: To use the bathroom PT Goal Formulation: With patient Time For Goal Achievement: 03/18/20 Potential to Achieve Goals: Good Progress towards PT goals: Progressing toward goals    Frequency    7X/week      PT Plan Current plan remains appropriate    Co-evaluation     PT goals addressed during session: Mobility/safety with mobility;Balance;Proper use of DME        AM-PAC PT "6 Clicks" Mobility   Outcome Measure  Help needed turning from your back to your side while in a flat bed without using bedrails?: None Help needed moving from lying on your back to sitting on the side of a flat bed without using bedrails?: None Help needed moving to and from a bed to a chair (including a wheelchair)?: A Little Help needed standing up from a chair using your arms (e.g., wheelchair or bedside chair)?: A Little Help needed to walk in hospital room?: A Lot Help needed climbing 3-5 steps with a railing? : A Lot 6 Click Score: 18    End of Session Equipment Utilized  During Treatment: Gait belt Activity Tolerance: Patient tolerated treatment well Patient left: in chair;with call bell/phone within reach;with chair alarm set Nurse Communication: Mobility status PT Visit Diagnosis: Unsteadiness on feet (R26.81);Repeated falls (R29.6);Muscle weakness (generalized) (M62.81);Ataxic gait (R26.0);Hemiplegia and hemiparesis Hemiplegia - Right/Left: Right Hemiplegia - dominant/non-dominant: Dominant Hemiplegia - caused by: Cerebral infarction     Time: 1400-1438 PT Time Calculation (min) (ACUTE ONLY): 38 min  Charges:                        Vale Haven, SPT   Vale Haven 03/10/2020, 4:44 PM

## 2020-03-10 NOTE — Plan of Care (Signed)
Patient alert and oriented x2. No complaints of pain. Up with assistance with standby assistance. Patient sat in chair majority of shift.

## 2020-03-11 MED ORDER — HYDROCHLOROTHIAZIDE 10 MG/ML ORAL SUSPENSION
6.2500 mg | Freq: Every day | ORAL | Status: DC
  Filled 2020-03-11: qty 1.25

## 2020-03-11 MED ORDER — HYDROCHLOROTHIAZIDE 12.5 MG PO CAPS: 12.5000 mg | ORAL_CAPSULE | Freq: Every day | ORAL | Status: DC

## 2020-03-11 MED ORDER — HYDROCHLOROTHIAZIDE 10 MG/ML ORAL SUSPENSION
6.2500 mg | Freq: Every day | ORAL | Status: DC
  Administered 2020-03-11: 6.25 mg via ORAL
  Filled 2020-03-11: qty 1.25

## 2020-03-11 MED ORDER — DILTIAZEM HCL ER COATED BEADS 180 MG PO CP24
360.0000 mg | ORAL_CAPSULE | Freq: Every day | ORAL | Status: DC
  Administered 2020-03-11 – 2020-03-13 (×3): 360 mg via ORAL
  Filled 2020-03-11 (×3): qty 2

## 2020-03-11 NOTE — TOC Progression Note (Signed)
Transition of Care [facility]) - Progression Note    Patient Details  Name: Madeline Park MRN: [id number] Date of Birth: 24-Jan-1951  Transition of Care [facility]) CM/SW [place], RN Phone Number: 03/11/2020, 9:52 AM  Clinical Narrative:    Suanne Marker with [facility] called, they would like to come and assess the patient today to be able to make a bed offer, the patient has had vaccines for Covid.         Expected Discharge Plan and Services                                                 Social Determinants of Health (SDOH) Interventions    Readmission Risk Interventions No flowsheet data found.

## 2020-03-11 NOTE — Progress Notes (Signed)
Report received from [facility] LPN.  Sky reports that her patient assessment was completed at ~1600 on 03/10/20 due to her shift start and end times. Patient is resting quietly in bed at this time.

## 2020-03-11 NOTE — Plan of Care (Signed)
  Problem: Education: Goal: Knowledge of General Education information will improve Description: Including pain rating scale, medication(s)/side effects and non-pharmacologic comfort measures Outcome: Progressing   Problem: Health Behavior/Discharge Planning: Goal: Ability to manage health-related needs will improve Outcome: Progressing   Problem: Clinical Measurements: Goal: Ability to maintain clinical measurements within normal limits will improve Outcome: Progressing Goal: Will remain free from infection Outcome: Progressing Goal: Diagnostic test results will improve Outcome: Progressing Goal: Respiratory complications will improve Outcome: Progressing Goal: Cardiovascular complication will be avoided Outcome: Progressing   Problem: Activity: Goal: Risk for activity intolerance will decrease Outcome: Progressing   Problem: Nutrition: Goal: Adequate nutrition will be maintained Outcome: Progressing   Problem: Coping: Goal: Level of anxiety will decrease Outcome: Progressing   Problem: Elimination: Goal: Will not experience complications related to bowel motility Outcome: Progressing Goal: Will not experience complications related to urinary retention Outcome: Progressing   Problem: Pain Managment: Goal: General experience of comfort will improve Outcome: Progressing   Problem: Safety: Goal: Ability to remain free from injury will improve Outcome: Progressing   Problem: Skin Integrity: Goal: Risk for impaired skin integrity will decrease Outcome: Progressing   Problem: Education: Goal: Knowledge of disease or condition will improve Outcome: Progressing Goal: Knowledge of secondary prevention will improve Outcome: Progressing Goal: Knowledge of patient specific risk factors addressed and post discharge goals established will improve Outcome: Progressing   Problem: Education: Goal: Knowledge of patient specific risk factors addressed and post discharge  goals established will improve Outcome: Progressing   Problem: Health Behavior/Discharge Planning: Goal: Ability to manage health-related needs will improve Outcome: Progressing   Problem: Self-Care: Goal: Ability to participate in self-care as condition permits will improve Outcome: Progressing Goal: Verbalization of feelings and concerns over difficulty with self-care will improve Outcome: Progressing Goal: Ability to communicate needs accurately will improve Outcome: Progressing   Problem: Nutrition: Goal: Risk of aspiration will decrease Outcome: Progressing Goal: Dietary intake will improve Outcome: Progressing   Problem: Ischemic Stroke/TIA Tissue Perfusion: Goal: Complications of ischemic stroke/TIA will be minimized Outcome: Progressing

## 2020-03-11 NOTE — TOC Progression Note (Signed)
Transition of Care [facility]) - Progression Note    Patient Details  Name: Madeline Park MRN: [id number] Date of Birth: [birth year]  Transition of Care [facility]) CM/SW [place], RN Phone Number: 03/11/2020, 3:41 PM  Clinical Narrative:     Damaris Schooner with Suanne Marker from [facility] [place], they are making a [facility] [place], I called the brother Shanon Brow and let him know that we have a bed offer, He is in agreeance and will call the other siblings to discuss, I explained I would accept the bed and that I would Start the insurance auth for approval.  The patient is on and off confused and the family will be making the decisions, I faxed the clinical information to [facility] at [phone] requesting auth approval,       Expected Discharge Plan and Services                                                 Social Determinants of Health (SDOH) Interventions    Readmission Risk Interventions No flowsheet data found.

## 2020-03-11 NOTE — Progress Notes (Signed)
Patient is alert and oriented x 2 to 3. Feeds self with setup help only. Ambulated to restroom with FWW without difficulty   With 1 assist.  No c/o pain or discomfort. Tolerated meds without difficulty , no dysphagia noted. Able to make needs known , call bell and hydration within reach

## 2020-03-11 NOTE — Progress Notes (Signed)
Patient ID: PIER LAUX, female   DOB: 1951/04/10, 69 y.o.   MRN: [id number]  PROGRESS NOTE    Madeline Park  ZDG:[id number] DOB: July 02, 1951 DOA: 03/03/2020 PCP: Tonia Ghent, MD    Brief Narrative:  69 year old lady with past medical history of hypertension, stage IV colon cancer with perforation, who presents with acute stroke, hypertensive emergency, AKI. Neurology is on board. Palliative care is consulted.  [facility] were consulted to admit the patient for further evaluation and treatment. Neurology has recommended ASA 325 and statin only. She is off of the nicardipine drip used to control her pressures although they remain somewhat elevated.  The patient has been evaluated by PT/OT. Recommendation was for CIR, but the patient has been evaluated by CIR and it has been determined that a more appropriate disposition would be to SNF for rehab and then to permanent placement.    Consultants:   Neurology  Procedures:   Antimicrobials:       Subjective: Eating breakfast in chair.  Has no complaints of shortness of breath, chest pain, dizziness, lightheadedness or any other symptoms  Objective: Vitals:   03/10/20 2352 03/11/20 0411 03/11/20 0500 03/11/20 0812  BP: (!) 170/94 (!) 151/94  138/88  Pulse: (!) 103 95  99  Resp: 20 16  18   Temp: 97.8 F (36.6 C) 97.7 F (36.5 C)  97.6 F (36.4 C)  TempSrc: Oral Oral    SpO2: 98% 98%  96%  Weight:   78.5 kg   Height:        Intake/Output Summary (Last 24 hours) at 03/11/2020 1441 Last data filed at 03/11/2020 1355 Gross per 24 hour  Intake 360 ml  Output --  Net 360 ml   Filed Weights   03/08/20 0500 03/10/20 0637 03/11/20 0500  Weight: 66.1 kg 79.8 kg 78.5 kg    Examination:  General exam: Appears calm and comfortable, NAD Respiratory system: Clear to auscultation. Respiratory effort normal. Cardiovascular system: S1 & S2 heard, RRR. No JVD, murmurs, rubs, gallops or clicks.  Gastrointestinal  system: Abdomen is nondistended, soft and nontender. Normal bowel sounds heard. Central nervous system: Alert and oriented x2, not to place.  Grossly intact deficits. Extremities: No edema Skin: Warm dry Psychiatry:  Mood & affect appropriate in current setting.     Data Reviewed: I have personally reviewed following labs and imaging studies  CBC: Recent Labs  Lab 03/05/20 0404 03/06/20 0537 03/08/20 0403  WBC 7.8 7.2 7.5  HGB 14.1 13.7 13.9  HCT 41.3 41.1 42.3  MCV 83.1 84.7 85.5  PLT 300 340 595   Basic Metabolic Panel: Recent Labs  Lab 03/05/20 0404 03/06/20 0537 03/07/20 0526 03/08/20 0403  NA 138 137 138 136  K 3.8 3.9 4.9 3.9  CL 102 105 108 105  CO2 26 21* 20* 23  GLUCOSE 107* 105* 84 98  BUN 33* 34* 24* 22  CREATININE 2.20* 1.41* 1.12* 1.08*  CALCIUM 9.3 9.1 9.3 9.1  MG 1.7  --   --   --   PHOS 3.6  --   --   --    GFR: Estimated Creatinine Clearance: 55.3 mL/min (A) (by C-G formula based on SCr of 1.08 mg/dL (H)). Liver Function Tests: No results for input(s): AST, ALT, ALKPHOS, BILITOT, PROT, ALBUMIN in the last 168 hours. No results for input(s): LIPASE, AMYLASE in the last 168 hours. No results for input(s): AMMONIA in the last 168 hours. Coagulation Profile: No results for input(s): INR,  PROTIME in the last 168 hours. Cardiac Enzymes: No results for input(s): CKTOTAL, CKMB, CKMBINDEX, TROPONINI in the last 168 hours. BNP (last 3 results) No results for input(s): PROBNP in the last 8760 hours. HbA1C: No results for input(s): HGBA1C in the last 72 hours. CBG: Recent Labs  Lab 03/04/20 1539  GLUCAP 81   Lipid Profile: No results for input(s): CHOL, HDL, LDLCALC, TRIG, CHOLHDL, LDLDIRECT in the last 72 hours. Thyroid Function Tests: No results for input(s): TSH, T4TOTAL, FREET4, T3FREE, THYROIDAB in the last 72 hours. Anemia Panel: No results for input(s): VITAMINB12, FOLATE, FERRITIN, TIBC, IRON, RETICCTPCT in the last 72 hours. Sepsis  Labs: No results for input(s): PROCALCITON, LATICACIDVEN in the last 168 hours.  Recent Results (from the past 240 hour(s))  SARS Coronavirus 2 by RT PCR (hospital order, performed in [facility] hospital lab) Nasopharyngeal Nasopharyngeal Swab     Status: None   Collection Time: 03/04/20  2:20 AM   Specimen: Nasopharyngeal Swab  Result Value Ref Range Status   SARS Coronavirus 2 NEGATIVE NEGATIVE Final    Comment: (NOTE) SARS-CoV-2 target nucleic acids are NOT DETECTED.  The SARS-CoV-2 RNA is generally detectable in upper and lower respiratory specimens during the acute phase of infection. The lowest concentration of SARS-CoV-2 viral copies this assay can detect is 250 copies / mL. A negative result does not preclude SARS-CoV-2 infection and should not be used as the sole basis for treatment or other patient management decisions.  A negative result may occur with improper specimen collection / handling, submission of specimen other than nasopharyngeal swab, presence of viral mutation(s) within the areas targeted by this assay, and inadequate number of viral copies (<250 copies / mL). A negative result must be combined with clinical observations, patient history, and epidemiological information.  Fact Sheet for Patients:   StrictlyIdeas.no  Fact Sheet for Healthcare Providers: BankingDealers.co.za  This test is not yet approved or  cleared by the [place] FDA and has been authorized for detection and/or diagnosis of SARS-CoV-2 by FDA under an Emergency Use Authorization (EUA).  This EUA will remain in effect (meaning this test can be used) for the duration of the COVID-19 declaration under Section 564(b)(1) of the Act, 21 U.S.C. section 360bbb-3(b)(1), unless the authorization is terminated or revoked sooner.  Performed at [facility], [address] [place], [place] [address]   MRSA PCR Screening     Status: None    Collection Time: 03/04/20  2:39 AM   Specimen: Nasopharyngeal  Result Value Ref Range Status   MRSA by PCR NEGATIVE NEGATIVE Final    Comment:        The GeneXpert MRSA Assay (FDA approved for NASAL specimens only), is one component of a comprehensive MRSA colonization surveillance program. It is not intended to diagnose MRSA infection nor to guide or monitor treatment for MRSA infections. Performed at [facility], [address] [place], [place] [address]   SARS Coronavirus 2 by RT PCR (hospital order, performed in [facility] hospital lab) Nasopharyngeal Nasopharyngeal Swab     Status: None   Collection Time: 03/10/20  8:56 PM   Specimen: Nasopharyngeal Swab  Result Value Ref Range Status   SARS Coronavirus 2 NEGATIVE NEGATIVE Final    Comment: (NOTE) SARS-CoV-2 target nucleic acids are NOT DETECTED.  The SARS-CoV-2 RNA is generally detectable in upper and lower respiratory specimens during the acute phase of infection. The lowest concentration of SARS-CoV-2 viral copies this assay can detect is 250  copies / mL. A negative result does not preclude SARS-CoV-2 infection and should not be used as the sole basis for treatment or other patient management decisions.  A negative result may occur with improper specimen collection / handling, submission of specimen other than nasopharyngeal swab, presence of viral mutation(s) within the areas targeted by this assay, and inadequate number of viral copies (<250 copies / mL). A negative result must be combined with clinical observations, patient history, and epidemiological information.  Fact Sheet for Patients:   StrictlyIdeas.no  Fact Sheet for Healthcare Providers: BankingDealers.co.za  This test is not yet approved or  cleared by the [place] FDA and has been authorized for detection and/or diagnosis of SARS-CoV-2 by FDA under an Emergency Use Authorization (EUA).   This EUA will remain in effect (meaning this test can be used) for the duration of the COVID-19 declaration under Section 564(b)(1) of the Act, 21 U.S.C. section 360bbb-3(b)(1), unless the authorization is terminated or revoked sooner.  Performed at [facility], [address] [place],  [address]          Radiology Studies: No results found.      Scheduled Meds:  aspirin  325 mg Oral Daily   diltiazem  360 mg Oral Daily   hydrALAZINE  100 mg Oral Q8H   hydrochlorothiazide  6.25 mg Oral Daily   simvastatin  20 mg Oral q1800   Continuous Infusions:  Assessment & Plan:   Principal Problem:   Stroke [facility]) Active Problems:   Essential hypertension   AKI (acute kidney injury) ([place])   Vitamin B12 deficiency   Hyperlipidemia   Acute CVA of left internal capsular and subinsular infarcts: The patient has been evaluated by neurology. She has been started on ASA 325 mg daily with simvastatin.   Hypertensive emergency: The patient is off of nicardipine drip. Will keep systolic blood pressure below 220, and slowly bring them down to a normal range.  started on diltiazem 60 mg and hydralazine 100mg  .  Patient stated sensitivity to metoprolol. Plan: Change Cardizem to Cardizem CD 360 mg daily Add HCTZ 6.25 mg daily  Hyperlipidemia: LDL is 78. Goal LSL is below 70. Start simvastatin 20 mg daily.  AKI: On admission creatinine was 1.11. It appears that that is likely her baseline. On the morning of 03/05/2020 it has increased to 2.20. After starting IV fluids it is down to 1.08. Will continue to avoid nephrotoxins, and hypotension. Will monitor creatinine, electrolytes, and volume status.  DC IV fluids  Vitamin B 12 deficiency: Noted. Supplement  History of rectal cancer Stage IV with perf and peritonitis in 2003. Noted.   DVT prophylaxis: SCD Code Status: DNR Family Communication: None at bedside Disposition Plan: SNF awaiting  authorization Status is: Inpatient  Remains inpatient appropriate because:Unsafe d/c plan   Dispo: The patient is from: Home              Anticipated d/c is to: SNF              Anticipated d/c date is: 1 day              Patient currently is not medically stable to d/c.  Awaiting authorization and better BP control with testing today . covid test pending           LOS: 7 days   Time spent:45 min with >50% on coc    Nolberto Hanlon, MD [facility] Pager [phone]  If 7PM-7AM, please contact night-coverage  [email or web address] Password [facility] 03/11/2020, 2:41 PM

## 2020-03-11 NOTE — Progress Notes (Signed)
Physical Therapy Treatment Patient Details Name: Madeline Park MRN: [id number] DOB: 08/10/1951 Today's Date: 03/11/2020    History of Present Illness Per MD notes: Pt is a 69 yo female with a PMH of Skin Cancer, Peritonitis, HTN, Metastatic Rectal Cancer to the Liver (dx 11/2002 s/p Hartmann and LAR with Ileostomy), and B12 Deficiency.  Pt presented to the ED with slurred speech and frequent falls.  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    PT Comments    Pt agrees to session.  To EOB with cues but no physical assist.  Steady in sitting.  Stood with min guard and is able to complete 2 laps around nursing unit with RW and min guard/assist.  Cues to avoid running into objects on right with 50% success despite verbal and tactile cues.  When I changed to guarding her on right she did better avoiding objects.  To commode upon return to room to void.  Walked in room to recliner without AD and min a x 1.  Overall improved gait with RW with decreased fall risk but continues to require +1 assist at all times.  Originally recommendation for CIR but review of SWS notes indicates SNF is plan given cognition and family unable to care for her once discharged from [place].  Recommendations updated.   Follow Up Recommendations  SNF     Equipment Recommendations  Rolling walker with 5" wheels;3in1 (PT)    Recommendations for Other Services       Precautions / Restrictions Precautions Precautions: Fall    Mobility  Bed Mobility Overal bed mobility: Needs Assistance Bed Mobility: Supine to Sit     Supine to sit: Supervision;Modified independent (Device/Increase time)        Transfers Overall transfer level: Needs assistance Equipment used: Rolling walker (2 wheeled) Transfers: Sit to/from Stand Sit to Stand: Min guard            Ambulation/Gait Ambulation/Gait assistance: Herbalist (Feet): 320 Feet Assistive device: Rolling walker (2 wheeled) Gait Pattern/deviations: Step-through pattern;Decreased step length - right;Decreased step length - left;Staggering right;Narrow base of support Gait velocity: decreased   General Gait Details: often runs into objects on right and needs cues with 50% success.  She does better with my guarding her on right vs left to avoid objects.   Stairs             Wheelchair Mobility    Modified Rankin (Stroke Patients Only)       Balance Overall balance assessment: Needs assistance Sitting-balance support: Feet supported;No upper extremity supported Sitting balance-Leahy Scale: Normal     Standing balance support: Bilateral upper extremity supported Standing balance-Leahy Scale: Fair Standing balance comment: required CGA for static balance                            Cognition Arousal/Alertness: Awake/alert Behavior During Therapy: WFL for tasks assessed/performed Overall Cognitive Status: No family/caregiver present to determine baseline cognitive functioning Area of Impairment: Attention;Following commands;Safety/judgement;Awareness                   Current Attention Level: Sustained;Divided   Following Commands: Follows one step commands consistently;Follows multi-step commands inconsistently       General Comments: Pt reported knowing that she needs to call for assistance to get up out of chair.      Exercises Other Exercises Other Exercises:  to commode to void.  cues for sefl care but able to do on her own.    General Comments        Pertinent Vitals/Pain Pain Assessment: No/denies pain    Home Living                      Prior Function            PT Goals (current goals can now be found in the care plan section) Progress towards PT goals: Progressing toward goals    Frequency    7X/week      PT Plan Discharge plan needs to be updated     Co-evaluation              AM-PAC PT "6 Clicks" Mobility   Outcome Measure  Help needed turning from your back to your side while in a flat bed without using bedrails?: None Help needed moving from lying on your back to sitting on the side of a flat bed without using bedrails?: None Help needed moving to and from a bed to a chair (including a wheelchair)?: A Little Help needed standing up from a chair using your arms (e.g., wheelchair or bedside chair)?: A Little Help needed to walk in hospital room?: A Little Help needed climbing 3-5 steps with a railing? : A Lot 6 Click Score: 19    End of Session Equipment Utilized During Treatment: Gait belt Activity Tolerance: Patient tolerated treatment well Patient left: in chair;with call bell/phone within reach;with chair alarm set Nurse Communication: Mobility status Hemiplegia - Right/Left: Right Hemiplegia - dominant/non-dominant: Dominant     Time: 7416-3845 PT Time Calculation (min) (ACUTE ONLY): 12 min  Charges:  $Gait Training: 8-22 mins                    Chesley Noon, PTA 03/11/20, 9:57 AM

## 2020-03-12 LAB — BASIC METABOLIC PANEL
Anion gap: 10 (ref 5–15)
BUN: 39 mg/dL — ABNORMAL HIGH (ref 8–23)
CO2: 24 mmol/L (ref 22–32)
Calcium: 9.7 mg/dL (ref 8.9–10.3)
Chloride: 99 mmol/L (ref 98–111)
Creatinine, Ser: 1.3 mg/dL — ABNORMAL HIGH (ref 0.44–1.00)
GFR calc Af Amer: 50 mL/min — ABNORMAL LOW (ref >60)
GFR calc non Af Amer: 43 mL/min — ABNORMAL LOW (ref >60)
Glucose, Bld: 100 mg/dL — ABNORMAL HIGH (ref 70–99)
Potassium: 3.8 mmol/L (ref 3.5–5.1)
Sodium: 133 mmol/L — ABNORMAL LOW (ref 135–145)

## 2020-03-12 MED ORDER — CLONIDINE HCL 0.1 MG PO TABS
0.1000 mg | ORAL_TABLET | Freq: Two times a day (BID) | ORAL | Status: DC
  Administered 2020-03-12 – 2020-03-13 (×3): 0.1 mg via ORAL
  Filled 2020-03-12 (×3): qty 1

## 2020-03-12 NOTE — TOC Progression Note (Signed)
Transition of Care [facility]) - Progression Note    Patient Details  Name: Madeline Park MRN: [id number] Date of Birth: Jun 08, 1951  Transition of [facility]) CM/SW [place], RN Phone Number: 03/12/2020, 12:37 PM  Clinical Narrative:     Received a call from [facility] requesting the DC summary to be faxed to them at [phone] showing medically stable to DC,       Expected Discharge Plan and Services                                                 Social Determinants of Health (SDOH) Interventions    Readmission Risk Interventions No flowsheet data found.

## 2020-03-12 NOTE — Care Management Important Message (Signed)
Important Message  Patient Details  Name: Madeline Park MRN: [id number] Date of Birth: 1950/12/07   Medicare Important Message Given:  Yes     Juliann Pulse A [place] Clasby 03/12/2020, 11:31 AM

## 2020-03-12 NOTE — Progress Notes (Signed)
Physical Therapy Treatment Patient Details Name: Madeline Park MRN: [id number] DOB: 08-14-1951 Today's Date: 03/12/2020    History of Present Illness Per MD notes: Pt is a 69 yo female with a PMH of Skin Cancer, Peritonitis, HTN, Metastatic Rectal Cancer to the Liver (dx 11/2002 s/p Hartmann and LAR with Ileostomy), and B12 Deficiency.  Pt presented to the ED with slurred speech and frequent falls.  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    PT Comments    Pt was seated in recliner upon arriving. She greets therapist and is very cooperative and pleasant throughout. Reports no pain throughout session. Was able to stand from recliner to [place] with CGA for safety. Ambulated 300 ' with RW with CGA most but several occasions of min assist to prevent fall. Pt is slightly impulsive and required constant re-enforcement to correct posture and improve BOS during ambulation. She does endorse fatigue and requested to return to room. Pt does have continued difficulty with word finding and slight R neglect throughout. Overall pt tolerated session well. She will benefit from continued skilled PT at SNF to address strength, coordination, balance, and safe functional mobility deficits. Pt was seated in recliner with call bell in reach, chair alarm in place, and RN aware of pt's abilities.     Follow Up Recommendations  SNF     Equipment Recommendations  Rolling walker with 5" wheels;3in1 (PT)    Recommendations for Other Services       Precautions / Restrictions Precautions Precautions: Fall    Mobility  Bed Mobility               General bed mobility comments: pt in recliner pre/post session  Transfers Overall transfer level: Needs assistance Equipment used: Rolling walker (2 wheeled) Transfers: Sit to/from Stand Sit to Stand: Min guard            Ambulation/Gait Ambulation/Gait  assistance: Min guard;Min assist   Assistive device: Rolling walker (2 wheeled) Gait Pattern/deviations: Step-through pattern;Decreased step length - right;Decreased step length - left;Staggering right;Narrow base of support Gait velocity: decreased   General Gait Details: pt has poor gait posture with constant vcs for posture correction. CGA for safety throughout most of ambulation but does require Min assist 2/2 to LOB. tends to Journalist, newspaper    Modified Rankin (Stroke Patients Only)       Balance Overall balance assessment: Needs assistance Sitting-balance support: Feet supported;No upper extremity supported Sitting balance-Leahy Scale: Normal     Standing balance support: Bilateral upper extremity supported Standing balance-Leahy Scale: Fair Standing balance comment: balance deficits present in all dynamic standing activity                            Cognition Arousal/Alertness: Awake/alert Behavior During Therapy: WFL for tasks assessed/performed Overall Cognitive Status: No family/caregiver present to determine baseline cognitive functioning                                 General Comments: Pt is alert and oriented x 3. was able to follow commands throughout but requires constant re-enforcement to continue desired task      Exercises      General Comments  Pertinent Vitals/Pain      Home Living                      Prior Function            PT Goals (current goals can now be found in the care plan section) Acute Rehab PT Goals Patient Stated Goal: To get home    Frequency    7X/week      PT Plan Current plan remains appropriate    Co-evaluation              AM-PAC PT "6 Clicks" Mobility   Outcome Measure  Help needed turning from your back to your side while in a flat bed without using bedrails?: None Help needed moving from lying on your back to  sitting on the side of a flat bed without using bedrails?: None Help needed moving to and from a bed to a chair (including a wheelchair)?: A Little Help needed standing up from a chair using your arms (e.g., wheelchair or bedside chair)?: A Little Help needed to walk in hospital room?: A Little Help needed climbing 3-5 steps with a railing? : A Lot 6 Click Score: 19    End of Session Equipment Utilized During Treatment: Gait belt Activity Tolerance: Patient tolerated treatment well Patient left: in chair;with call bell/phone within reach;with chair alarm set Nurse Communication: Mobility status PT Visit Diagnosis: Unsteadiness on feet (R26.81);Repeated falls (R29.6);Muscle weakness (generalized) (M62.81);Ataxic gait (R26.0);Hemiplegia and hemiparesis Hemiplegia - Right/Left: Right Hemiplegia - dominant/non-dominant: Dominant Hemiplegia - caused by: Cerebral infarction     Time:  -     Charges:                        Julaine Fusi PTA 03/12/20, 4:03 PM

## 2020-03-12 NOTE — Progress Notes (Signed)
Occupational Therapy Treatment Patient Details Name: Madeline Park MRN: [id number] DOB: 12-09-1950 Today's Date: 03/12/2020    History of present illness Per MD notes: Pt is a 69 yo female with a PMH of Skin Cancer, Peritonitis, HTN, Metastatic Rectal Cancer to the Liver (dx 11/2002 s/p Hartmann and LAR with Ileostomy), and B12 Deficiency.  Pt presented to the ED with slurred speech and frequent falls.  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   OT comments  Ms. Furuta was seen for OT treatment on this date. Upon arrival to room pt awake/alert, seated upright in room recliner. Pt agreeable to OT tx session, and states "that sounds wonderful" when asked if she would like to wash her hair and perform UB grooming tasks. OT facilitates ADL activities as described below. See ADL section for additional detail regarding occupational performance. Pt participates well throughout session and consistently requires cueing for safety and sequencing of tasks in addition to physical assist. She continues to demo min R sided inattention/neglect with decreased initiation of activities positioned to her R. With cueing, she is able to identify objects/items needed for ADL management to her R side. Pt making good progress toward goals and continues to benefit from skilled OT services to maximize return to PLOF and minimize risk of future falls, injury, caregiver burden, and readmission. Will continue to follow POC as written. Discharge recommendation remains appropriate.    Follow Up Recommendations  CIR;SNF    Equipment Recommendations       Recommendations for Other Services      Precautions / Restrictions Precautions Precautions: Fall Restrictions Weight Bearing Restrictions: No       Mobility Bed Mobility               General bed mobility comments: Deferred. Pt in recliner at start/end of  session.  Transfers Overall transfer level: Needs assistance Equipment used: Rolling walker (2 wheeled) Transfers: Sit to/from Stand Sit to Stand: Supervision;Min guard              Balance Overall balance assessment: Needs assistance Sitting-balance support: Feet supported;No upper extremity supported Sitting balance-Leahy Scale: Normal Sitting balance - Comments: Pt able to maintain dynamic sitting balance w/o UE support during functional tasks.   Standing balance support: Bilateral upper extremity supported Standing balance-Leahy Scale: Fair Standing balance comment: balance deficits present in all dynamic standing activity                           ADL either performed or assessed with clinical judgement   ADL Overall ADL's : Needs assistance/impaired     Grooming: Oral care;Sitting;Minimal assistance;Set up;Cueing for sequencing;Brushing hair Grooming Details (indicate cue type and reason): MOD cues to initiate/terminiate movements. Noted significant decreased R wrist/elbow extension, with min facilitation pt is able to comb hair and brush teeth using RUE. Upper Body Bathing: Sitting;Minimal assistance;Cueing for sequencing Upper Body Bathing Details (indicate cue type and reason): MIN A to don shampoo cap and perform hair washing this date. Pt uses BUE to scrub for ~1 min, then begins to use only LUE. With prompting she is able to re-engage RUE. Min assist for thoroughness.             Toilet Transfer: Supervision/safety;Stand-pivot;BSC;Cueing for sequencing;Cueing for Office manager Details (indicate cue type and reason): Pt stands and begins to amb to [facility], does not aknowledge chair alarm. When this Chief Strategy Officer  asks where she is going, she states "I need to use the bathroom" pt able to sit back in chair while therapist sets-up BSC. She uses BSC with supervision for safety. Toileting- Water quality scientist and Hygiene: Cueing for sequencing;Sit to/from  stand;Cueing for safety;Supervision/safety       Functional mobility during ADLs: Min guard;Cueing for safety;Cueing for sequencing       Vision Baseline Vision/History: Wears glasses Wears Glasses: At all times Additional Comments: Pt continues to demo min R inattention/neglect   Perception     Praxis      Cognition Arousal/Alertness: Awake/alert Behavior During Therapy: WFL for tasks assessed/performed Overall Cognitive Status: No family/caregiver present to determine baseline cognitive functioning Area of Impairment: Attention;Following commands;Safety/judgement;Awareness                 Orientation Level: Disoriented to;Situation;Time Current Attention Level: Selective;Alternating   Following Commands: Follows multi-step commands inconsistently;Follows one step commands with increased time Safety/Judgement: Decreased awareness of safety;Decreased awareness of deficits     General Comments: Pt is alert and oriented x 3. was able to follow commands throughout but requires constant re-enforcement to continue desired task        Exercises Other Exercises Other Exercises: to commode to void.  cues for sefl care but able to do on her own. Other Exercises: Therapist provides set-up to MIN A for seated UB washing and grooming tasks this date. See ADL section for additional detail.   Shoulder Instructions       General Comments      Pertinent Vitals/ Pain       Pain Assessment: No/denies pain  Home Living                                          Prior Functioning/Environment              Frequency  Min 3X/week        Progress Toward Goals  OT Goals(current goals can now be found in the care plan section)  Progress towards OT goals: Progressing toward goals  Acute Rehab OT Goals Patient Stated Goal: To get home OT Goal Formulation: With patient Time For Goal Achievement: 03/19/20 Potential to Achieve Goals: Good  Plan  Discharge plan remains appropriate;Frequency remains appropriate    Co-evaluation                 AM-PAC OT "6 Clicks" Daily Activity     Outcome Measure   Help from another person eating meals?: A Little Help from another person taking care of personal grooming?: A Little Help from another person toileting, which includes using toliet, bedpan, or urinal?: A Little Help from another person bathing (including washing, rinsing, drying)?: A Lot Help from another person to put on and taking off regular upper body clothing?: A Lot Help from another person to put on and taking off regular lower body clothing?: A Little 6 Click Score: 16    End of Session    OT Visit Diagnosis: Other abnormalities of gait and mobility (R26.89);Other symptoms and signs involving cognitive function;Cognitive communication deficit (R41.841);Muscle weakness (generalized) (M62.81);Hemiplegia and hemiparesis Symptoms and signs involving cognitive functions: Cerebral infarction Hemiplegia - Right/Left: Right Hemiplegia - dominant/non-dominant: Dominant Hemiplegia - caused by: Cerebral infarction   Activity Tolerance Patient tolerated treatment well   Patient Left in chair;with call bell/phone within reach;with chair alarm set   Nurse  Communication Other (comment) (Pt urinated during session.)        Time: 2878-6767 OT Time Calculation (min): 25 min  Charges: OT General Charges $OT Visit: 1 Visit OT Treatments $Self Care/Home Management : 23-37 mins  Shara Blazing, M.S., OTR/L Ascom: [phone] 03/12/20, 4:32 PM

## 2020-03-12 NOTE — TOC Progression Note (Signed)
Transition of Care [facility]) - Progression Note    Patient Details  Name: ZAIAH CREDEUR MRN: [id number] Date of Birth: [birth year]  Transition of Care [facility]) CM/SW [place], RN Phone Number: 03/12/2020, 1:56 PM  Clinical Narrative:     Damaris Schooner with Suanne Marker at [facility] at [phone], if we do not get insurance approval today they can take the patient over the weekend but has to have DC summary by noon sat to take on the weekend,  Suanne Marker said TOC to contact her and she can pull the DC summary        Expected Discharge Plan and Services                                                 Social Determinants of Health (SDOH) Interventions    Readmission Risk Interventions No flowsheet data found.

## 2020-03-12 NOTE — Progress Notes (Signed)
Patient ID: Madeline Park, female   DOB: 1951/01/26, 69 y.o.   MRN: [id number]  PROGRESS NOTE    Madeline Park  ONG:[id number] DOB: December 31, 1950 DOA: 03/03/2020 PCP: Tonia Ghent, MD    Brief Narrative:  69 year old lady with past medical history of hypertension, stage IV colon cancer with perforation, who presents with acute stroke, hypertensive emergency, AKI. Neurology is on board. Palliative care is consulted.  [facility] were consulted to admit the patient for further evaluation and treatment. Neurology has recommended ASA 325 and statin only. She is off of the nicardipine drip used to control her pressures although they remain somewhat elevated.  The patient has been evaluated by PT/OT. Recommendation was for CIR, but the patient has been evaluated by CIR and it has been determined that a more appropriate disposition would be to SNF for rehab and then to permanent placement.    Consultants:   Neurology  Procedures:   Antimicrobials:       Subjective: Patient sitting eating breakfast in chair.  Has no complaints  Objective: Vitals:   03/12/20 0604 03/12/20 0805 03/12/20 1351 03/12/20 1529  BP: (!) 175/107 (!) 146/88 (!) 147/77 137/78  Pulse: 85 85 75 78  Resp:  17  17  Temp:  (!) 97.5 F (36.4 C)  97.9 F (36.6 C)  TempSrc:  Oral  Oral  SpO2:  98%  98%  Weight:      Height:       No intake or output data in the 24 hours ending 03/12/20 1914 Filed Weights   03/10/20 4401 03/11/20 0500 03/12/20 0500  Weight: 79.8 kg 78.5 kg 82.6 kg    Examination:  General exam: Calm, comfortable eating breakfast Respiratory system: CTA, no wheeze rales rhonchi's Cardiovascular system: Regular, S1-S2 no murmurs rubs gallops clicks noted  Gastrointestinal system: Abdomen is nondistended, soft and nontender. Normal bowel sounds heard. Central nervous system: Alert and oriented x2 grossly intact deficits. Extremities: No edema Skin: Warm dry Psychiatry:   Mood & affect appropriate in current setting.     Data Reviewed: I have personally reviewed following labs and imaging studies  CBC: Recent Labs  Lab 03/06/20 0537 03/08/20 0403  WBC 7.2 7.5  HGB 13.7 13.9  HCT 41.1 42.3  MCV 84.7 85.5  PLT 340 027   Basic Metabolic Panel: Recent Labs  Lab 03/06/20 0537 03/07/20 0526 03/08/20 0403 03/12/20 0623  NA 137 138 136 133*  K 3.9 4.9 3.9 3.8  CL 105 108 105 99  CO2 21* 20* 23 24  GLUCOSE 105* 84 98 100*  BUN 34* 24* 22 39*  CREATININE 1.41* 1.12* 1.08* 1.30*  CALCIUM 9.1 9.3 9.1 9.7   GFR: Estimated Creatinine Clearance: 47 mL/min (A) (by C-G formula based on SCr of 1.3 mg/dL (H)). Liver Function Tests: No results for input(s): AST, ALT, ALKPHOS, BILITOT, PROT, ALBUMIN in the last 168 hours. No results for input(s): LIPASE, AMYLASE in the last 168 hours. No results for input(s): AMMONIA in the last 168 hours. Coagulation Profile: No results for input(s): INR, PROTIME in the last 168 hours. Cardiac Enzymes: No results for input(s): CKTOTAL, CKMB, CKMBINDEX, TROPONINI in the last 168 hours. BNP (last 3 results) No results for input(s): PROBNP in the last 8760 hours. HbA1C: No results for input(s): HGBA1C in the last 72 hours. CBG: No results for input(s): GLUCAP in the last 168 hours. Lipid Profile: No results for input(s): CHOL, HDL, LDLCALC, TRIG, CHOLHDL, LDLDIRECT in the last 72 hours.  Thyroid Function Tests: No results for input(s): TSH, T4TOTAL, FREET4, T3FREE, THYROIDAB in the last 72 hours. Anemia Panel: No results for input(s): VITAMINB12, FOLATE, FERRITIN, TIBC, IRON, RETICCTPCT in the last 72 hours. Sepsis Labs: No results for input(s): PROCALCITON, LATICACIDVEN in the last 168 hours.  Recent Results (from the past 240 hour(s))  SARS Coronavirus 2 by RT PCR (hospital order, performed in [facility] hospital lab) Nasopharyngeal Nasopharyngeal Swab     Status: None   Collection Time: 03/04/20  2:20 AM    Specimen: Nasopharyngeal Swab  Result Value Ref Range Status   SARS Coronavirus 2 NEGATIVE NEGATIVE Final    Comment: (NOTE) SARS-CoV-2 target nucleic acids are NOT DETECTED.  The SARS-CoV-2 RNA is generally detectable in upper and lower respiratory specimens during the acute phase of infection. The lowest concentration of SARS-CoV-2 viral copies this assay can detect is 250 copies / mL. A negative result does not preclude SARS-CoV-2 infection and should not be used as the sole basis for treatment or other patient management decisions.  A negative result may occur with improper specimen collection / handling, submission of specimen other than nasopharyngeal swab, presence of viral mutation(s) within the areas targeted by this assay, and inadequate number of viral copies (<250 copies / mL). A negative result must be combined with clinical observations, patient history, and epidemiological information.  Fact Sheet for Patients:   StrictlyIdeas.no  Fact Sheet for Healthcare Providers: BankingDealers.co.za  This test is not yet approved or  cleared by the [place] FDA and has been authorized for detection and/or diagnosis of SARS-CoV-2 by FDA under an Emergency Use Authorization (EUA).  This EUA will remain in effect (meaning this test can be used) for the duration of the COVID-19 declaration under Section 564(b)(1) of the Act, 21 U.S.C. section 360bbb-3(b)(1), unless the authorization is terminated or revoked sooner.  Performed at [facility], [address] [place], [place] [address]   MRSA PCR Screening     Status: None   Collection Time: 03/04/20  2:39 AM   Specimen: Nasopharyngeal  Result Value Ref Range Status   MRSA by PCR NEGATIVE NEGATIVE Final    Comment:        The GeneXpert MRSA Assay (FDA approved for NASAL specimens only), is one component of a comprehensive MRSA colonization surveillance program. It  is not intended to diagnose MRSA infection nor to guide or monitor treatment for MRSA infections. Performed at [facility], [address] [place], [place] [address]   SARS Coronavirus 2 by RT PCR (hospital order, performed in [facility] hospital lab) Nasopharyngeal Nasopharyngeal Swab     Status: None   Collection Time: 03/10/20  8:56 PM   Specimen: Nasopharyngeal Swab  Result Value Ref Range Status   SARS Coronavirus 2 NEGATIVE NEGATIVE Final    Comment: (NOTE) SARS-CoV-2 target nucleic acids are NOT DETECTED.  The SARS-CoV-2 RNA is generally detectable in upper and lower respiratory specimens during the acute phase of infection. The lowest concentration of SARS-CoV-2 viral copies this assay can detect is 250 copies / mL. A negative result does not preclude SARS-CoV-2 infection and should not be used as the sole basis for treatment or other patient management decisions.  A negative result may occur with improper specimen collection / handling, submission of specimen other than nasopharyngeal swab, presence of viral mutation(s) within the areas targeted by this assay, and inadequate number of viral copies (<250 copies / mL). A negative result must be combined with clinical  observations, patient history, and epidemiological information.  Fact Sheet for Patients:   StrictlyIdeas.no  Fact Sheet for Healthcare Providers: BankingDealers.co.za  This test is not yet approved or  cleared by the [place] FDA and has been authorized for detection and/or diagnosis of SARS-CoV-2 by FDA under an Emergency Use Authorization (EUA).  This EUA will remain in effect (meaning this test can be used) for the duration of the COVID-19 declaration under Section 564(b)(1) of the Act, 21 U.S.C. section 360bbb-3(b)(1), unless the authorization is terminated or revoked sooner.  Performed at [facility], [address]  [place], [place] [address]          Radiology Studies: No results found.      Scheduled Meds: . aspirin  325 mg Oral Daily  . cloNIDine  0.1 mg Oral BID  . diltiazem  360 mg Oral Daily  . hydrALAZINE  100 mg Oral Q8H  . simvastatin  20 mg Oral q1800   Continuous Infusions:  Assessment & Plan:   Principal Problem:   Stroke [facility]) Active Problems:   Essential hypertension   AKI (acute kidney injury) ([place])   Vitamin B12 deficiency   Hyperlipidemia   Acute CVA of left internal capsular and subinsular infarcts: The patient has been evaluated by neurology.  Continue aspirin 325 mg and statin    Hypertensive emergency: The patient is off of nicardipine drip. Will keep systolic blood pressure below 220, and slowly bring them down to a normal range.  started on diltiazem 60 mg and hydralazine 100mg  . Patient stated sensitivity to metoprolol. Continue Cardizem CD Plan Discontinue HCTZ as her sodium dropped and creatinine increased Add clonidine   Hyperlipidemia: LDL is 78. Goal LDL less than 70 Continue simvastatin   AKI: On admission creatinine was 1.11. It appears that that is likely her baseline. On the morning of 03/05/2020 it has increased to 2.20. After starting IV fluids it is down to 1.08. HCTZ was and patient's creatinine increased to 1.30, medication was discontinued and patient was encouraged to increase fluid intake Check a.m. labs avoid nephrotoxins, and hypotension.  Hyponatremia-mild after hctz started hctz d/c'd Monitor labs   Vitamin B 12 deficiency: Noted. Supplement  History of rectal cancer Stage IV with perf and peritonitis in 2003. Noted.   DVT prophylaxis: SCD Code Status: DNR Family Communication: None at bedside Disposition Plan: SNF awaiting authorization Status is: Inpatient  Remains inpatient appropriate because:Unsafe d/c plan   Dispo: The patient is from: Home              Anticipated d/c is to: SNF              Anticipated  d/c date is: 1 day              Patient currently is not medically stable to d/c.  Awaiting authorization . covid test pending           LOS: 8 days   Time spent:45 min with >50% on coc    Nolberto Hanlon, MD [facility] Pager [phone]  If 7PM-7AM, please contact night-coverage [email or web address] Password [email or web address] 03/12/2020, 7:14 PM

## 2020-03-13 LAB — BASIC METABOLIC PANEL
Anion gap: 8 (ref 5–15)
BUN: 42 mg/dL — ABNORMAL HIGH (ref 8–23)
CO2: 26 mmol/L (ref 22–32)
Calcium: 9.3 mg/dL (ref 8.9–10.3)
Chloride: 99 mmol/L (ref 98–111)
Creatinine, Ser: 1.27 mg/dL — ABNORMAL HIGH (ref 0.44–1.00)
GFR calc Af Amer: 51 mL/min — ABNORMAL LOW (ref >60)
GFR calc non Af Amer: 44 mL/min — ABNORMAL LOW (ref >60)
Glucose, Bld: 96 mg/dL (ref 70–99)
Potassium: 3.6 mmol/L (ref 3.5–5.1)
Sodium: 133 mmol/L — ABNORMAL LOW (ref 135–145)

## 2020-03-13 MED ORDER — DILTIAZEM HCL ER COATED BEADS 360 MG PO CP24: 360.0000 mg | ORAL_CAPSULE | Freq: Every day | ORAL | Status: AC

## 2020-03-13 MED ORDER — POLYETHYLENE GLYCOL 3350 17 G PO PACK: 17.0000 g | PACK | Freq: Every day | ORAL | 0 refills | Status: AC | PRN

## 2020-03-13 MED ORDER — DOCUSATE SODIUM 100 MG PO CAPS: 100.0000 mg | ORAL_CAPSULE | Freq: Two times a day (BID) | ORAL | 0 refills | Status: AC | PRN

## 2020-03-13 MED ORDER — SIMVASTATIN 20 MG PO TABS: 20.0000 mg | ORAL_TABLET | Freq: Every day | ORAL | Status: AC

## 2020-03-13 MED ORDER — CLONIDINE HCL 0.2 MG PO TABS: 0.1000 mg | ORAL_TABLET | Freq: Two times a day (BID) | ORAL | Status: AC

## 2020-03-13 MED ORDER — ASPIRIN 325 MG PO TABS: 325.0000 mg | ORAL_TABLET | Freq: Every day | ORAL | Status: AC

## 2020-03-13 MED ORDER — HYDRALAZINE HCL 100 MG PO TABS: 100.0000 mg | ORAL_TABLET | Freq: Three times a day (TID) | ORAL | Status: AC

## 2020-03-13 NOTE — Discharge Summary (Signed)
Madeline Park RFF:[id number] DOB: May 27, 1951 DOA: 03/03/2020  PCP: Tonia Ghent, MD  Admit date: 03/03/2020 Discharge date: 03/13/2020  Admitted From: Home Disposition: SNF  Recommendations for Outpatient Follow-up:  1. Follow up with PCP in 1 week 2. Please obtain BMP/CBC in one week 3. Neurology Dr. Manuella Ghazi in 2 weeks     Discharge Condition:Stable CODE STATUS: DNR Diet recommendation: Heart Healthy, low-sodium less than 2 g  Brief/Interim Summary: Per H&P on admission This is a 69 yo female with a PMH of Skin Cancer, Peritonitis, HTN, Metastatic Rectal Cancer to the Liver (dx 11/2002 s/p Hartmann and LAR with Ileostomy), and B12 Deficiency.  She presented to [facility] ER on 06/30 after the brother reported she was "not acting like herself," had slurred speech, and frequent falls. Per ER notes pts brother reported he had unsuccessfully tried to contact the pt over the past week, therefore he went to pts home and noticed the pt was confused.  Upon arrival to the ER pt had continued slurred speech, anomic aphasia, right-sided weakness, and rapid alternating movement on the right side. Pt extremely hypertensive bp 250s/160's, therefore nicardipine gtt initiated.  CT Head negative for acute intracranial hemorrhage or intracranial mass lesion, however revealed age indeterminate hypodensity/possible infarcts within the thalamus/basal ganglia/white matter.  MR Brain revealed 15 mm acute ischemic nonhemorrhagic infarct involving the posterior limb of the left internal capsule, with an additional 5 mm acute ischemic nonhemorrhagic infarct involving the adjacent left subinsular white matter.  PCCM team contacted for ICU admission.  Neurology was consulted.She was weaned off Nicardipine gtt. Per neurology's recommendation she was started on Asa 325mg  and statin. She was started on p.o. blood pressure medications and transferred to the hospitalist service.  Physical therapy and Occupational Therapy was  consulted.  They recommended CIR, but the patient has been evaluated by CIR and it has been determined that a more appropriate disposition would be to SNF for rehab and then to permanent placement.  Acute CVA of left internal capsular and subinsular infarcts: The patient has been evaluated by neurology.  Continue aspirin 325 mg and statin  Follow up neurology as outpatient    Hypertensive emergency: The patient is off of nicardipine drip. Will keep systolic blood pressure below 220, and slowly bring them down to a normal range.  started on diltiazem60 mg and hydralazine 100mg  . Patient stated sensitivity to metoprolol. Continue Cardizem CD Discontinued HCTZ as her sodium dropped and creatinine increased Increase clonidine to  0.2 mg bid.   Hyperlipidemia: LDL is 78. Goal LDL less than 70 Continue simvastatin   AKI: On admission creatinine was 1.11. It appears that that is likely her baseline. On the morning of 03/05/2020 it has increased to 2.20. After starting IV fluids it is down to 1.08. HCTZ was and patient's creatinine increased to 1.30, medication was discontinued and patient was encouraged to increase fluid intake Today Creatinine 1.27, improving slowly avoid nephrotoxins, and hypotension.  Hyponatremia-mild after hctz started hctz d/c'd Na at 131. Continue to monitor as outpatient   Vitamin B 12 deficiency: Noted. Supplement  History of rectal cancer Stage IV with perf and peritonitis in 2003. Noted. `  Discharge Diagnoses:  Principal Problem:   Stroke [facility]) Active Problems:   Essential hypertension   AKI (acute kidney injury) ([place])   Vitamin B12 deficiency   Hyperlipidemia    Discharge Instructions  Discharge Instructions    Call MD for:  persistant dizziness or light-headedness   Complete by: As directed  Call MD for:  temperature >100.4   Complete by: As directed    Diet - low sodium heart healthy   Complete by: As directed    Discharge  instructions   Complete by: As directed    Follow up with neurology in one week F/u with pcp in one week   Increase activity slowly   Complete by: As directed      Allergies as of 03/13/2020      Reactions   Ivp Dye [iodinated Diagnostic Agents] Rash   Rash and itching   Promethazine Hcl    Muscle spasms   Imodium [loperamide]    Rash   Metoprolol Other (See Comments)   GI upset, not a true allergic reaction.    Molasses    Itching, rash   Propofol [propofol]    vomiting   Lisinopril Rash      Medication List    STOP taking these medications   cetirizine 10 MG tablet Commonly known as: ZYRTEC   cholestyramine 4 GM/DOSE powder Commonly known as: QUESTRAN   diphenhydrAMINE 25 mg capsule Commonly known as: BENADRYL   METAMUCIL PO   ondansetron 4 MG tablet Commonly known as: ZOFRAN     TAKE these medications   aspirin 325 MG tablet Take 1 tablet (325 mg total) by mouth daily. Start taking on: March 14, 2020   cloNIDine 0.2 MG tablet Commonly known as: CATAPRES Take 0.5 tablets (0.1 mg total) by mouth 2 (two) times daily. What changed:   how much to take  when to take this   cyanocobalamin 1000 MCG/ML injection Commonly known as: (VITAMIN B-12) INJECT 1ML INTO THE MUSCLE TWICE A MONTH.   diltiazem 360 MG 24 hr capsule Commonly known as: CARDIZEM CD Take 1 capsule (360 mg total) by mouth daily. Start taking on: March 14, 2020   docusate sodium 100 MG capsule Commonly known as: COLACE Take 1 capsule (100 mg total) by mouth 2 (two) times daily as needed for mild constipation.   hydrALAZINE 100 MG tablet Commonly known as: APRESOLINE Take 1 tablet (100 mg total) by mouth every 8 (eight) hours.   multivitamin with minerals tablet Take by mouth.   polyethylene glycol 17 g packet Commonly known as: MIRALAX / GLYCOLAX Take 17 g by mouth daily as needed for moderate constipation.   simvastatin 20 MG tablet Commonly known as: ZOCOR Take 1 tablet (20 mg  total) by mouth daily at 6 PM.   Vitamin D (Ergocalciferol) 1.25 MG (50000 UNIT) Caps capsule Commonly known as: DRISDOL Take 1 capsule (50,000 Units total) by mouth every 7 (seven) days.       Follow-up Information    Tonia Ghent, MD Follow up in 1 week(s).   Specialty: Family Medicine Contact information: [place] [place] [address] [phone]        Vladimir Crofts, MD Follow up in 2 week(s).   Specialty: Neurology Contact information: [facility] [place] [address] [phone]              Allergies  Allergen Reactions  . Ivp Dye [Iodinated Diagnostic Agents] Rash    Rash and itching  . Promethazine Hcl     Muscle spasms  . Imodium [Loperamide]     Rash  . Metoprolol Other (See Comments)    GI upset, not a true allergic reaction.   . Molasses     Itching, rash  . Propofol [  Propofol]     vomiting  . Lisinopril Rash    Consultations:  Neurology   Procedures/Studies: CT HEAD WO CONTRAST  Result Date: 03/04/2020 CLINICAL DATA:  Altered mental status. Right-sided weakness. Stroke. EXAM: CT HEAD WITHOUT CONTRAST TECHNIQUE: Contiguous axial images were obtained from the base of the skull through the vertex without intravenous contrast. COMPARISON:  CT and MRI 03/03/2020 FINDINGS: Brain: Acute infarct posterior limb internal capsule shows progressive low-density. Extensive chronic microvascular ischemic changes throughout the white matter bilaterally. Chronic infarcts in the thalamus bilaterally left greater than right. Mild atrophy. Negative for hydrocephalus. Chronic ischemic changes in the cerebellum bilaterally. Negative for hemorrhage or mass lesion. Vascular: Negative for hyperdense vessel Skull: Negative Sinuses/Orbits: Paranasal sinuses clear.  Negative orbit Other: None IMPRESSION: Acute infarct posterior limb internal capsule left shows progressive low-density edema  compared with CT yesterday. Atrophy. Advanced chronic small vessel ischemic changes. No acute hemorrhage. Electronically Signed   By: Franchot Gallo M.D.   On: 03/04/2020 11:41   CT HEAD WO CONTRAST  Result Date: 03/03/2020 CLINICAL DATA:  Confusion EXAM: CT HEAD WITHOUT CONTRAST TECHNIQUE: Contiguous axial images were obtained from the base of the skull through the vertex without intravenous contrast. COMPARISON:  None. FINDINGS: Brain: No hemorrhage or intracranial mass is visualized. Extensive white matter hypodensity. Age indeterminate hypodensity within the left greater than right thalamus and basal ganglia. Mild ventricular enlargement. Vascular: No hyperdense vessels.  Carotid vascular calcification Skull: Normal. Negative for fracture or focal lesion. Sinuses/Orbits: No acute finding. Other: None IMPRESSION: 1. Negative for acute intracranial hemorrhage or intracranial mass lesion. 2. Age indeterminate hypodensity/possible infarcts within the thalamus, basal ganglia and white matter. Advanced chronic small vessel ischemic change of the white matter. MRI could be considered for further evaluation given extensive white matter hypodensity and lack of prior studies for comparison to determine chronicity. Electronically Signed   By: Donavan Foil M.D.   On: 03/03/2020 19:38   MR BRAIN WO CONTRAST  Result Date: 03/04/2020 CLINICAL DATA:  Initial evaluation for acute ataxia, stroke suspected. EXAM: MRI HEAD WITHOUT CONTRAST TECHNIQUE: Multiplanar, multiecho pulse sequences of the brain and surrounding structures were obtained without intravenous contrast. COMPARISON:  Prior CT from earlier the same day. FINDINGS: Brain: Examination moderately degraded by motion artifact. Generalized age-related cerebral atrophy. Extensive patchy and confluent T2/FLAIR hyperintensity seen throughout the periventricular and deep white matter both cerebral hemispheres. Extensive patchy involvement of the deep gray nuclei,  brainstem, and cerebellum. Findings most likely related to advanced chronic microvascular ischemic disease. Multiple superimposed remote lacunar infarcts present about the bilateral basal ganglia/thalami. 15 mm focus of diffusion abnormality seen involving the posterior limb of the left internal capsule, consistent with an acute ischemic infarct (series 5, image 26). Additional 5 mm acute ischemic infarct noted involving the adjacent subinsular white matter (series 5, image 23). No associated hemorrhage or mass effect. No other evidence for acute or subacute ischemia. Gray-white matter differentiation otherwise maintained. No acute intracranial hemorrhage. Innumerable scattered chronic micro hemorrhages seen throughout the brain, preponderance of which are clustered about the cerebellum and thalami, likely related to poorly controlled hypertension. No mass lesion, midline shift or mass effect. No hydrocephalus or extra-axial fluid collection. Pituitary gland suprasellar region within normal limits. Midline structures intact. Vascular: Major intracranial vascular flow voids are maintained. Skull and upper cervical spine: Craniocervical junction within normal limits. Bone marrow signal intensity normal. No scalp soft tissue abnormality. Sinuses/Orbits: Globes and orbital soft tissues within normal limits. Paranasal sinuses are largely  clear. No significant mastoid effusion. Inner ear structures grossly normal. Other: None. IMPRESSION: 1. 15 mm acute ischemic nonhemorrhagic infarct involving the posterior limb of the left internal capsule, with an additional 5 mm acute ischemic nonhemorrhagic infarct involving the adjacent left subinsular white matter. 2. Innumerable scattered chronic micro hemorrhages throughout the brain, preponderance of which are clustered about the cerebellum and thalami, likely related to poorly controlled hypertension. 3. Underlying age-related cerebral atrophy with advanced chronic  microvascular ischemic disease. Electronically Signed   By: Jeannine Boga M.D.   On: 03/04/2020 00:22   US Carotid Bilateral  Result Date: 03/04/2020 CLINICAL DATA:  69 year old female with stroke EXAM: BILATERAL CAROTID DUPLEX ULTRASOUND TECHNIQUE: Pearline Cables scale imaging, color Doppler and duplex ultrasound were performed of bilateral carotid and vertebral arteries in the neck. COMPARISON:  None. FINDINGS: Criteria: Quantification of carotid stenosis is based on velocity parameters that correlate the residual internal carotid diameter with NASCET-based stenosis levels, using the diameter of the distal internal carotid lumen as the denominator for stenosis measurement. The following velocity measurements were obtained: RIGHT ICA:  Systolic 71 cm/sec, Diastolic 19 cm/sec CCA:  67 cm/sec SYSTOLIC ICA/CCA RATIO:  1.1 ECA:  57 cm/sec LEFT ICA:  Systolic 58 cm/sec, Diastolic 17 cm/sec CCA:  61 cm/sec SYSTOLIC ICA/CCA RATIO:  0.9 ECA:  58 cm/sec Right Brachial SBP: Not acquired Left Brachial SBP: Not acquired RIGHT CAROTID ARTERY: No significant calcifications of the right common carotid artery. Intermediate waveform maintained. Heterogeneous and partially calcified plaque at the right carotid bifurcation. No significant lumen shadowing. Low resistance waveform of the right ICA. No significant tortuosity. RIGHT VERTEBRAL ARTERY: Antegrade flow with low resistance waveform. LEFT CAROTID ARTERY: No significant calcifications of the left common carotid artery. Intermediate waveform maintained. Heterogeneous and partially calcified plaque at the left carotid bifurcation without significant lumen shadowing. Low resistance waveform of the left ICA. No significant tortuosity. LEFT VERTEBRAL ARTERY:  Antegrade flow with low resistance waveform. IMPRESSION: Color duplex indicates minimal heterogeneous and calcified plaque, with no hemodynamically significant stenosis by duplex criteria in the extracranial cerebrovascular  circulation. Signed, Dulcy Fanny. Dellia Nims, RPVI [facility] Electronically Signed   By: Corrie Mckusick D.O.   On: 03/04/2020 14:10   ECHOCARDIOGRAM COMPLETE  Result Date: 03/04/2020    ECHOCARDIOGRAM REPORT   Patient Name:   KEMYRA AUGUST Date of Exam: 03/04/2020 Medical Rec #:  [id number]         Height:       67.0 in Accession #:    [id number]        Weight:       143.3 lb Date of Birth:  May 10, 1951         BSA:          1.755 m Patient Age:    64 years          BP:           177/94 mmHg Patient Gender: F                 HR:           94 bpm. Exam Location:  [facility] Procedure: 2D Echo, Cardiac Doppler and Color Doppler Indications:     Stroke 434.91  History:         Patient has no prior history of Echocardiogram examinations.                  Risk Factors:Hypertension.  Sonographer:  Sherrie Sport RDCS (AE) Referring Phys:  [id number] Awilda Bill Diagnosing Phys: Neoma Laming MD  Sonographer Comments: No apical window and no subcostal window. IMPRESSIONS  1. Left ventricular ejection fraction, by estimation, is 60 to 65%. The left ventricle has normal function. The left ventricle has no regional wall motion abnormalities. There is severe concentric left ventricular hypertrophy. Left ventricular diastolic  function could not be evaluated.  2. Right ventricular systolic function is normal. The right ventricular size is mildly enlarged.  3. Left atrial size was mild to moderately dilated.  4. Right atrial size was mild to moderately dilated.  5. The mitral valve is normal in structure. No evidence of mitral valve regurgitation. No evidence of mitral stenosis.  6. The aortic valve is normal in structure. Aortic valve regurgitation is trivial. No aortic stenosis is present.  7. The inferior vena cava is normal in size with greater than 50% respiratory variability, suggesting right atrial pressure of 3 mmHg. Conclusion(s)/Recommendation(s): Findings consistent  with hypertensive heart disease. No intracardiac source of embolism detected on this transthoracic study. A transesophageal echocardiogram is recommended to exclude cardiac source of embolism if clinically indicated. FINDINGS  Left Ventricle: Left ventricular ejection fraction, by estimation, is 60 to 65%. The left ventricle has normal function. The left ventricle has no regional wall motion abnormalities. The left ventricular internal cavity size was normal in size. There is  severe concentric left ventricular hypertrophy. Left ventricular diastolic function could not be evaluated. Right Ventricle: The right ventricular size is mildly enlarged. No increase in right ventricular wall thickness. Right ventricular systolic function is normal. Left Atrium: Left atrial size was mild to moderately dilated. Right Atrium: Right atrial size was mild to moderately dilated. Pericardium: Trivial pericardial effusion is present. The pericardial effusion is circumferential. Mitral Valve: The mitral valve is normal in structure. Normal mobility of the mitral valve leaflets. No evidence of mitral valve regurgitation. No evidence of mitral valve stenosis. Tricuspid Valve: The tricuspid valve is normal in structure. Tricuspid valve regurgitation is not demonstrated. No evidence of tricuspid stenosis. Aortic Valve: The aortic valve is normal in structure. Aortic valve regurgitation is trivial. No aortic stenosis is present. Pulmonic Valve: The pulmonic valve was normal in structure. Pulmonic valve regurgitation is not visualized. No evidence of pulmonic stenosis. Aorta: The aortic root is normal in size and structure. Venous: The inferior vena cava is normal in size with greater than 50% respiratory variability, suggesting right atrial pressure of 3 mmHg. IAS/Shunts: No atrial level shunt detected by color flow Doppler.  LEFT VENTRICLE PLAX 2D LVIDd:         4.19 cm LVIDs:         2.77 cm LV PW:         1.50 cm LV IVS:        1.76 cm  LVOT diam:     2.20 cm LVOT Area:     3.80 cm  LEFT ATRIUM         Index LA diam:    3.40 cm 1.94 cm/m                        PULMONIC VALVE AORTA                 PV Vmax:        0.54 m/s Ao Root diam: 3.20 cm PV Peak grad:   1.2 mmHg  RVOT Peak grad: 3 mmHg   SHUNTS Systemic Diam: 2.20 cm Neoma Laming MD Electronically signed by Neoma Laming MD Signature Date/Time: 03/04/2020/11:28:03 AM    Final        Subjective: Patient without any complaints today.  Sitting in bed asking to go to the bathroom.  No chest pain or shortness of breath, dizziness or headaches  Discharge Exam: Vitals:   03/13/20 0601 03/13/20 0811  BP: (!) 143/92 (!) 156/86  Pulse: 68 66  Resp:  16  Temp:  97.6 F (36.4 C)  SpO2:  98%   Vitals:   03/12/20 2019 03/13/20 0500 03/13/20 0601 03/13/20 0811  BP: (!) 180/83  (!) 143/92 (!) 156/86  Pulse: 77  68 66  Resp: 18   16  Temp: 97.6 F (36.4 C)   97.6 F (36.4 C)  TempSrc: Oral   Oral  SpO2: 98%   98%  Weight:  79.4 kg    Height:        General: Pt is alert, awake,oriented x3 this am. not in acute distress Cardiovascular: RRR, S1/S2 +, no rubs, no gallops Respiratory: CTA bilaterally, no wheezing, no rhonchi Abdominal: Soft, NT, ND, bowel sounds + Extremities: no edema, no cyanosis    The results of significant diagnostics from this hospitalization (including imaging, microbiology, ancillary and laboratory) are listed below for reference.     Microbiology: Recent Results (from the past 240 hour(s))  SARS Coronavirus 2 by RT PCR (hospital order, performed in [facility] hospital lab) Nasopharyngeal Nasopharyngeal Swab     Status: None   Collection Time: 03/04/20  2:20 AM   Specimen: Nasopharyngeal Swab  Result Value Ref Range Status   SARS Coronavirus 2 NEGATIVE NEGATIVE Final    Comment: (NOTE) SARS-CoV-2 target nucleic acids are NOT DETECTED.  The SARS-CoV-2 RNA is generally detectable in upper and lower respiratory  specimens during the acute phase of infection. The lowest concentration of SARS-CoV-2 viral copies this assay can detect is 250 copies / mL. A negative result does not preclude SARS-CoV-2 infection and should not be used as the sole basis for treatment or other patient management decisions.  A negative result may occur with improper specimen collection / handling, submission of specimen other than nasopharyngeal swab, presence of viral mutation(s) within the areas targeted by this assay, and inadequate number of viral copies (<250 copies / mL). A negative result must be combined with clinical observations, patient history, and epidemiological information.  Fact Sheet for Patients:   StrictlyIdeas.no  Fact Sheet for Healthcare Providers: BankingDealers.co.za  This test is not yet approved or  cleared by the [place] FDA and has been authorized for detection and/or diagnosis of SARS-CoV-2 by FDA under an Emergency Use Authorization (EUA).  This EUA will remain in effect (meaning this test can be used) for the duration of the COVID-19 declaration under Section 564(b)(1) of the Act, 21 U.S.C. section 360bbb-3(b)(1), unless the authorization is terminated or revoked sooner.  Performed at [facility], [address] [place], [place] [address]   MRSA PCR Screening     Status: None   Collection Time: 03/04/20  2:39 AM   Specimen: Nasopharyngeal  Result Value Ref Range Status   MRSA by PCR NEGATIVE NEGATIVE Final    Comment:        The GeneXpert MRSA Assay (FDA approved for NASAL specimens only), is one component of a comprehensive MRSA colonization surveillance program. It is not intended to diagnose MRSA infection nor to guide  or monitor treatment for MRSA infections. Performed at [facility], [address] [place], [place] [address]   SARS Coronavirus 2 by RT PCR (hospital order, performed in [facility] hospital lab) Nasopharyngeal Nasopharyngeal Swab     Status: None   Collection Time: 03/10/20  8:56 PM   Specimen: Nasopharyngeal Swab  Result Value Ref Range Status   SARS Coronavirus 2 NEGATIVE NEGATIVE Final    Comment: (NOTE) SARS-CoV-2 target nucleic acids are NOT DETECTED.  The SARS-CoV-2 RNA is generally detectable in upper and lower respiratory specimens during the acute phase of infection. The lowest concentration of SARS-CoV-2 viral copies this assay can detect is 250 copies / mL. A negative result does not preclude SARS-CoV-2 infection and should not be used as the sole basis for treatment or other patient management decisions.  A negative result may occur with improper specimen collection / handling, submission of specimen other than nasopharyngeal swab, presence of viral mutation(s) within the areas targeted by this assay, and inadequate number of viral copies (<250 copies / mL). A negative result must be combined with clinical observations, patient history, and epidemiological information.  Fact Sheet for Patients:   StrictlyIdeas.no  Fact Sheet for Healthcare Providers: BankingDealers.co.za  This test is not yet approved or  cleared by the [place] FDA and has been authorized for detection and/or diagnosis of SARS-CoV-2 by FDA under an Emergency Use Authorization (EUA).  This EUA will remain in effect (meaning this test can be used) for the duration of the COVID-19 declaration under Section 564(b)(1) of the Act, 21 U.S.C. section 360bbb-3(b)(1), unless the authorization is terminated or revoked sooner.  Performed at [facility], [address] [place], [place] [address]      Labs: BNP (last 3 results) No results for input(s): BNP in the last 8760 hours. Basic Metabolic Panel: Recent Labs  Lab 03/07/20 0526 03/08/20 0403 03/12/20 0623 03/13/20 0500  NA 138 136 133* 133*  K 4.9 3.9 3.8  3.6  CL 108 105 99 99  CO2 20* 23 24 26   GLUCOSE 84 98 100* 96  BUN 24* 22 39* 42*  CREATININE 1.12* 1.08* 1.30* 1.27*  CALCIUM 9.3 9.1 9.7 9.3   Liver Function Tests: No results for input(s): AST, ALT, ALKPHOS, BILITOT, PROT, ALBUMIN in the last 168 hours. No results for input(s): LIPASE, AMYLASE in the last 168 hours. No results for input(s): AMMONIA in the last 168 hours. CBC: Recent Labs  Lab 03/08/20 0403  WBC 7.5  HGB 13.9  HCT 42.3  MCV 85.5  PLT 290   Cardiac Enzymes: No results for input(s): CKTOTAL, CKMB, CKMBINDEX, TROPONINI in the last 168 hours. BNP: Invalid input(s): POCBNP CBG: No results for input(s): GLUCAP in the last 168 hours. D-Dimer No results for input(s): DDIMER in the last 72 hours. Hgb A1c No results for input(s): HGBA1C in the last 72 hours. Lipid Profile No results for input(s): CHOL, HDL, LDLCALC, TRIG, CHOLHDL, LDLDIRECT in the last 72 hours. Thyroid function studies No results for input(s): TSH, T4TOTAL, T3FREE, THYROIDAB in the last 72 hours.  Invalid input(s): FREET3 Anemia work up No results for input(s): VITAMINB12, FOLATE, FERRITIN, TIBC, IRON, RETICCTPCT in the last 72 hours. Urinalysis    Component Value Date/Time   COLORURINE STRAW (A) 03/03/2020 2003   APPEARANCEUR CLEAR (A) 03/03/2020 2003   APPEARANCEUR Clear 10/25/2011 1537   LABSPEC 1.008 03/03/2020 2003   LABSPEC 1.002 10/25/2011 1537   PHURINE 6.0 03/03/2020 2003   GLUCOSEU  NEGATIVE 03/03/2020 2003   GLUCOSEU Negative 10/25/2011 1537   HGBUR NEGATIVE 03/03/2020 2003   BILIRUBINUR NEGATIVE 03/03/2020 2003   BILIRUBINUR Negative 10/25/2011 [place] 03/03/2020 2003   PROTEINUR NEGATIVE 03/03/2020 2003   NITRITE NEGATIVE 03/03/2020 2003   LEUKOCYTESUR NEGATIVE 03/03/2020 2003   LEUKOCYTESUR Negative 10/25/2011 1537   Sepsis Labs Invalid input(s): PROCALCITONIN,  WBC,  LACTICIDVEN Microbiology Recent Results (from the past 240 hour(s))  SARS  Coronavirus 2 by RT PCR (hospital order, performed in [place] hospital lab) Nasopharyngeal Nasopharyngeal Swab     Status: None   Collection Time: 03/04/20  2:20 AM   Specimen: Nasopharyngeal Swab  Result Value Ref Range Status   SARS Coronavirus 2 NEGATIVE NEGATIVE Final    Comment: (NOTE) SARS-CoV-2 target nucleic acids are NOT DETECTED.  The SARS-CoV-2 RNA is generally detectable in upper and lower respiratory specimens during the acute phase of infection. The lowest concentration of SARS-CoV-2 viral copies this assay can detect is 250 copies / mL. A negative result does not preclude SARS-CoV-2 infection and should not be used as the sole basis for treatment or other patient management decisions.  A negative result may occur with improper specimen collection / handling, submission of specimen other than nasopharyngeal swab, presence of viral mutation(s) within the areas targeted by this assay, and inadequate number of viral copies (<250 copies / mL). A negative result must be combined with clinical observations, patient history, and epidemiological information.  Fact Sheet for Patients:   StrictlyIdeas.no  Fact Sheet for Healthcare Providers: BankingDealers.co.za  This test is not yet approved or  cleared by the [place] FDA and has been authorized for detection and/or diagnosis of SARS-CoV-2 by FDA under an Emergency Use Authorization (EUA).  This EUA will remain in effect (meaning this test can be used) for the duration of the COVID-19 declaration under Section 564(b)(1) of the Act, 21 U.S.C. section 360bbb-3(b)(1), unless the authorization is terminated or revoked sooner.  Performed at [facility], [address] [place], [place] [address] [address]   MRSA PCR Screening     Status: None   Collection Time: 03/04/20  2:39 AM   Specimen: Nasopharyngeal  Result Value Ref Range Status   MRSA by PCR NEGATIVE NEGATIVE  Final    Comment:        The GeneXpert MRSA Assay (FDA approved for NASAL specimens only), is one component of a comprehensive MRSA colonization surveillance program. It is not intended to diagnose MRSA infection nor to guide or monitor treatment for MRSA infections. Performed at [facility], [address] [place], [place] [address]   SARS Coronavirus 2 by RT PCR (hospital order, performed in [facility] hospital lab) Nasopharyngeal Nasopharyngeal Swab     Status: None   Collection Time: 03/10/20  8:56 PM   Specimen: Nasopharyngeal Swab  Result Value Ref Range Status   SARS Coronavirus 2 NEGATIVE NEGATIVE Final    Comment: (NOTE) SARS-CoV-2 target nucleic acids are NOT DETECTED.  The SARS-CoV-2 RNA is generally detectable in upper and lower respiratory specimens during the acute phase of infection. The lowest concentration of SARS-CoV-2 viral copies this assay can detect is 250 copies / mL. A negative result does not preclude SARS-CoV-2 infection and should not be used as the sole basis for treatment or other patient management decisions.  A negative result may occur with improper specimen collection / handling, submission of specimen other than nasopharyngeal swab, presence of viral mutation(s) within the areas targeted  by this assay, and inadequate number of viral copies (<250 copies / mL). A negative result must be combined with clinical observations, patient history, and epidemiological information.  Fact Sheet for Patients:   StrictlyIdeas.no  Fact Sheet for Healthcare Providers: BankingDealers.co.za  This test is not yet approved or  cleared by the [place] FDA and has been authorized for detection and/or diagnosis of SARS-CoV-2 by FDA under an Emergency Use Authorization (EUA).  This EUA will remain in effect (meaning this test can be used) for the duration of the COVID-19 declaration under Section  564(b)(1) of the Act, 21 U.S.C. section 360bbb-3(b)(1), unless the authorization is terminated or revoked sooner.  Performed at [facility], [address], [place], [place] [address]      Time coordinating discharge: Over 30 minutes  SIGNED:   Nolberto Hanlon, MD  [facility] 03/13/2020, 10:49 AM Pager   If 7PM-7AM, please contact night-coverage [email or web address] Password [email or web address]

## 2020-03-13 NOTE — TOC Transition Note (Signed)
Transition of Care [facility]) - CM/SW Discharge Note   Patient Details  Name: Madeline Park MRN: [id number] Date of Birth: March 14, 1951  [facility] CM/SW Contact:  Boris Sharper, LCSW Phone Number: 03/13/2020, 12:05 PM   Clinical Narrative:    Pt medically stable for discharge per MD. Pt will be transported to [facility] in [place], [place] via [facility]. CSW notified pt's sister of discharge.   Final next level of care: Skilled Nursing Facility Barriers to Discharge: Transportation   Patient Goals and CMS Choice        Discharge Placement              Patient chooses bed at: Other - please specify in the comment section below: ([place], [place]) Patient to be transferred to facility by: [facility] Name of family member notified: Deb Patient and family notified of of transfer: 03/13/20  Discharge Plan and Services                                     Social Determinants of Health (SDOH) Interventions     Readmission Risk Interventions No flowsheet data found.

## 2020-03-25 ENCOUNTER — Telehealth: Payer: Self-pay | Admitting: Family Medicine

## 2020-03-25 NOTE — Telephone Encounter (Signed)
Christy from [facility] called needing the immunization records of patient faxed to her at [phone] extension [phone]. She does get admitted tomorrow so if she can get those faxed over ASAP that'd be great!

## 2020-03-25 NOTE — Telephone Encounter (Signed)
Immunization report faxed as requested.

## 2020-05-26 ENCOUNTER — Other Ambulatory Visit: Payer: Self-pay | Admitting: Family Medicine

## 2020-05-27 NOTE — Telephone Encounter (Signed)
Electronic refill request. Drisdol Last office visit:   02/20/2020 Last Filled:    13 capsule 0 02/20/2020  Does she need Vit D level?

## 2020-05-28 NOTE — Telephone Encounter (Signed)
My understanding was that she was transferred to a facility after previous hospitalization.  It would make sense for the facility MD to manage this.  If she is going to be able to follow-up here then let me know but otherwise we should decline this.  Thanks.

## 2020-06-01 NOTE — Telephone Encounter (Signed)
No answer at home number and no VM.  Because patient is likely in a facility, left a detailed message on sister's voicemail asking for a return call with information.

## 2020-06-01 NOTE — Telephone Encounter (Signed)
Sister phoned in saying that the patient's facility is handling all of her medications.  Patient is doing well.  She is the youngest person there at the facility and is trying to get all the residents on board in sports.  She states luckily there is only 1 [employer] fan.

## 2020-06-02 NOTE — Telephone Encounter (Signed)
Duly noted.  Thanks.  Glad to hear.

## 2022-03-02 IMAGING — MR MR HEAD W/O CM
12 of 13 series · 39 of 48 positions shown · non-contrast
Comparison: Prior CT from earlier the same day.

CLINICAL DATA: Initial evaluation for acute ataxia, stroke
suspected.

EXAM:
MRI HEAD WITHOUT CONTRAST
TECHNIQUE: Multiplanar, multiecho pulse sequences of the brain and surrounding
structures were obtained without intravenous contrast.

[Series 5: ax dwi_tracew · axial · 3.0mm · 0.60mm/px · z∈[-69,+85]mm · 3 of 48 slices shown]
[im 1/48]
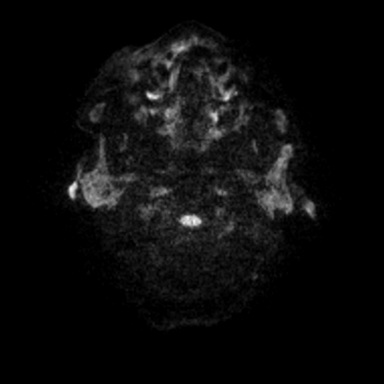
[im 24/48]
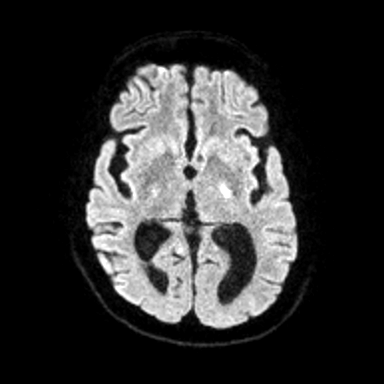
[im 48/48]
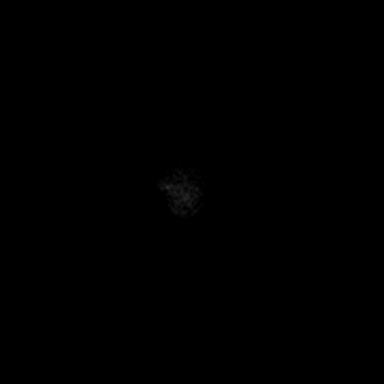

[Series 6: ax dwi_adc · axial · 3.0mm · 0.60mm/px · z∈[-69,+85]mm · 4 of 48 slices shown]
[im 1/48]
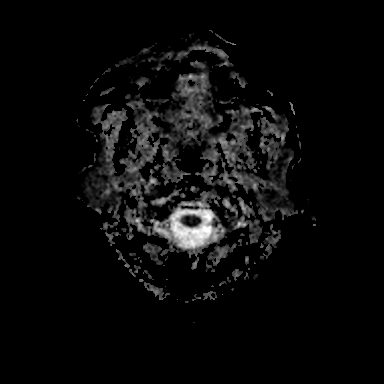
[im 16/48]
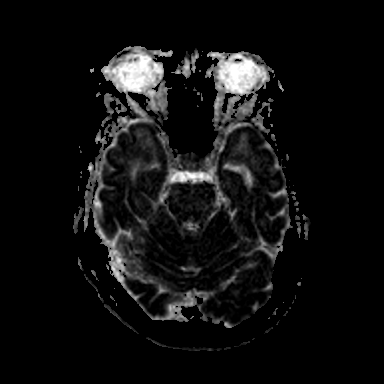
[im 32/48]
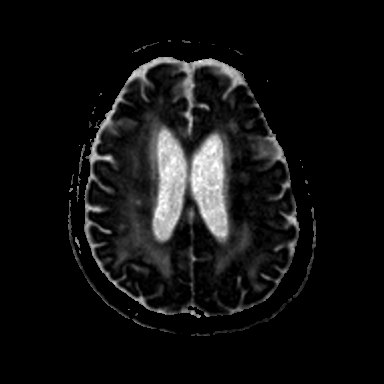
[im 48/48]
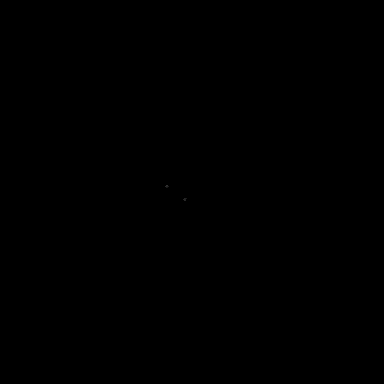

[Series 7: cor dwi_tracew · coronal · 5.0mm · 0.60mm/px · 3 of 40 slices shown]
[im 1/40]
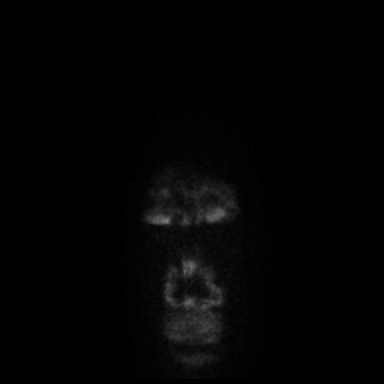
[im 20/40]
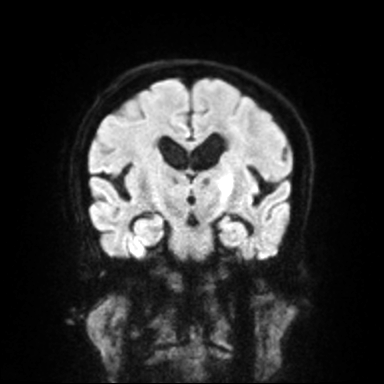
[im 40/40]
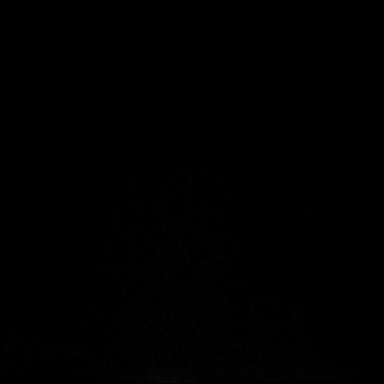

[Series 8: cor dwi_adc · coronal · 5.0mm · 0.60mm/px · 3 of 38 slices shown]
[im 1/38]
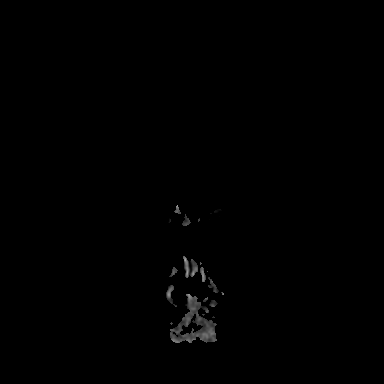
[im 19/38]
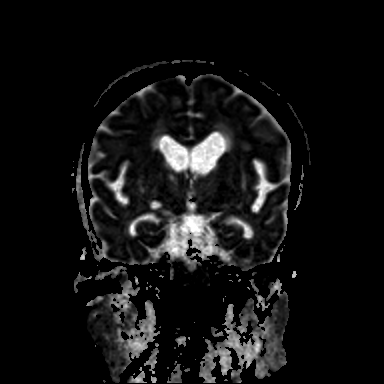
[im 38/38]
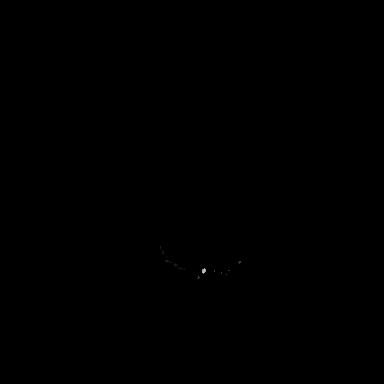

[Series 9: T1 · sagittal · 5.0mm · 0.62mm/px · 2 of 25 slices shown (1 of 3)]
[im 1/25]
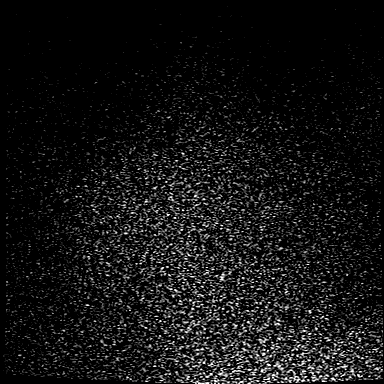
[im 25/25]
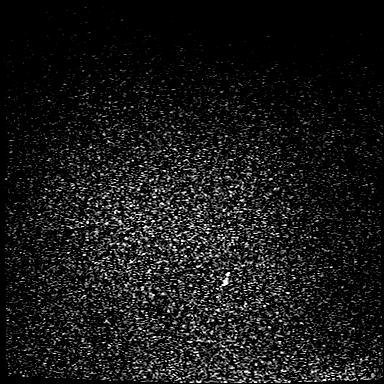

[Series 10: T2 · axial · 5.0mm · 0.53mm/px · z∈[-66,+83]mm · 2 of 26 slices shown (1 of 2)]
[im 1/26]
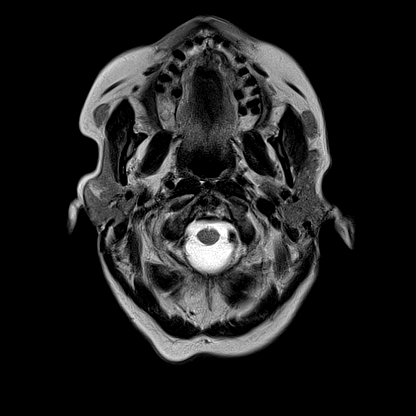
[im 26/26]
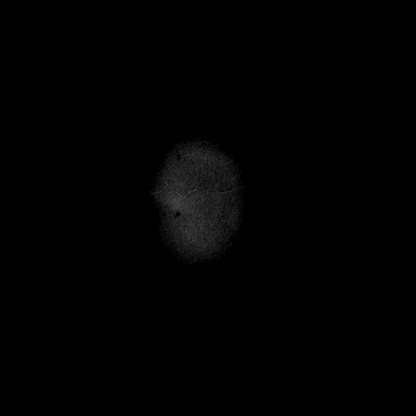

[Series 12: pha_images · axial · 3.0mm · 0.90mm/px · z∈[-79,+94]mm · 4 of 55 slices shown]
[im 1/55]
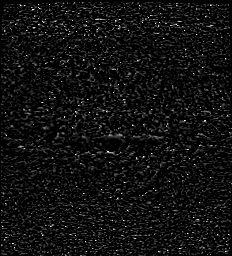
[im 19/55]
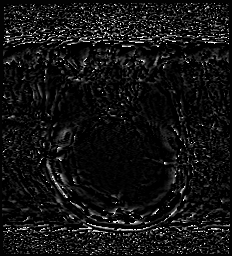
[im 37/55]
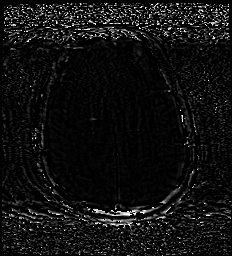
[im 55/55]
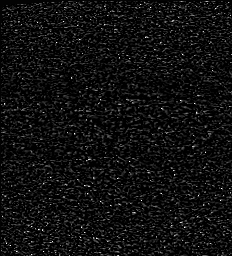

[Series 15: FLAIR · axial · 3.0mm · 0.53mm/px · z∈[-72,+89]mm · 4 of 55 slices shown (1 of 2)]
[im 1/55]
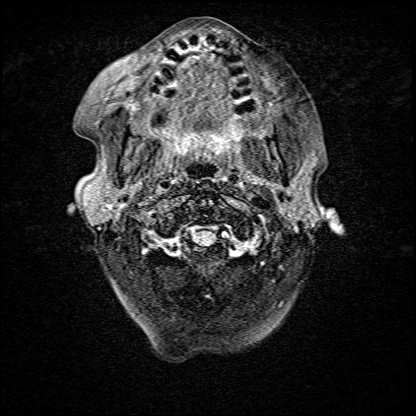
[im 19/55]
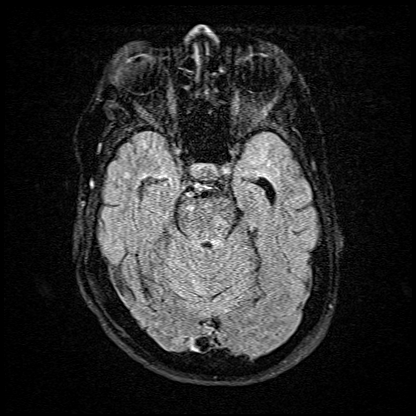
[im 37/55]
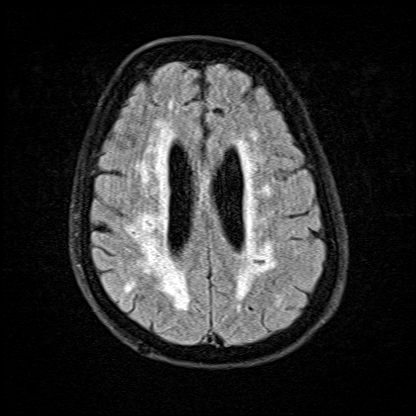
[im 55/55]
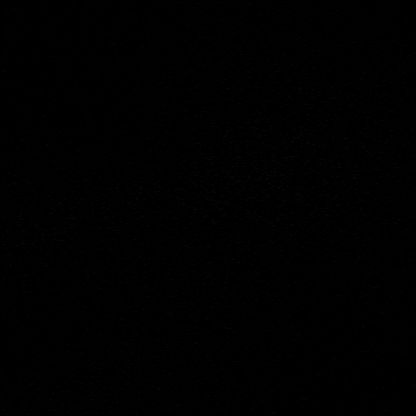

[Series 16: T1 · axial · 1.0mm · 0.98mm/px · z∈[-79,+94]mm · 8 of 173 slices shown (2 of 3)]
[im 1/173]
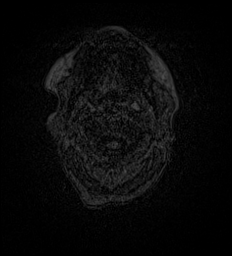
[im 29/173]
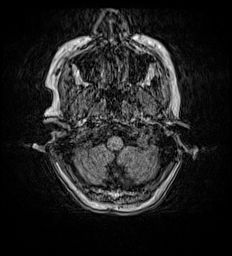
[im 58/173]
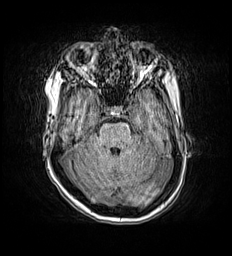
[im 72/173]
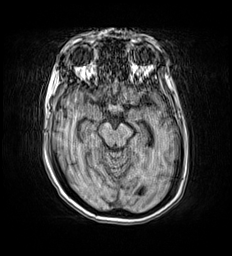
[im 101/173]
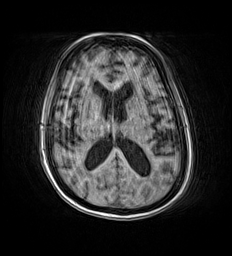
[im 115/173]
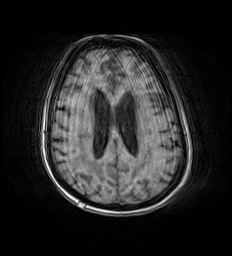
[im 144/173]
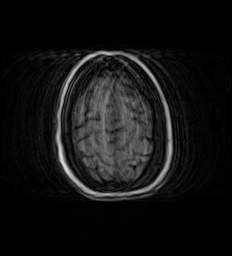
[im 173/173]
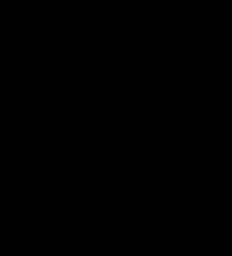

[Series 17: T2 · coronal · 5.0mm · 0.45mm/px · 2 of 31 slices shown (2 of 2)]
[im 1/31]
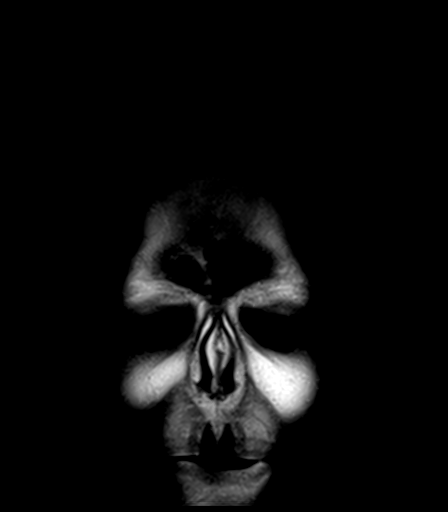
[im 31/31]
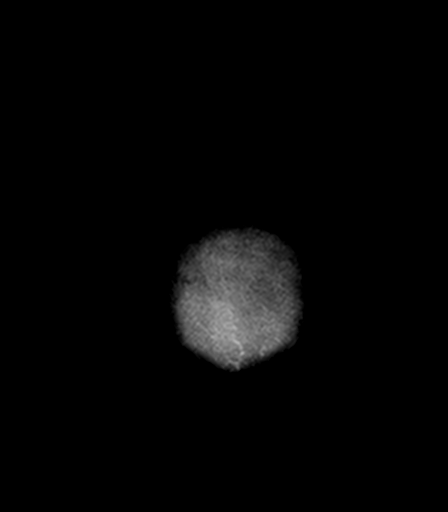

[Series 18: FLAIR · axial · 5.0mm · 1.20mm/px · z∈[-70,+86]mm · 2 of 27 slices shown (2 of 2)]
[im 1/27]
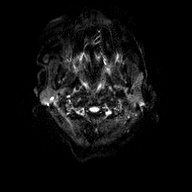
[im 27/27]
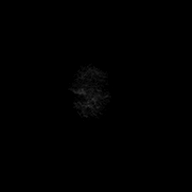

[Series 19: T1 · axial · 5.0mm · 0.90mm/px · z∈[-68,+87]mm · 2 of 27 slices shown (3 of 3)]
[im 1/27]
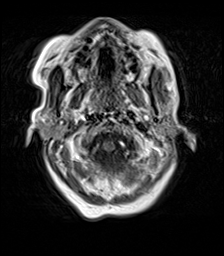
[im 27/27]
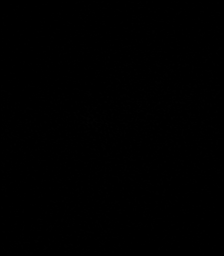

[39 of 48 positions shown; findings below may reference images not displayed]

FINDINGS: Brain: Examination moderately degraded by motion artifact.

Generalized age-related cerebral atrophy. Extensive patchy and
confluent T2/FLAIR hyperintensity seen throughout the
periventricular and deep white matter both cerebral hemispheres.
Extensive patchy involvement of the deep gray nuclei, brainstem, and
cerebellum. Findings most likely related to advanced chronic
microvascular ischemic disease. Multiple superimposed remote lacunar
infarcts present about the bilateral basal ganglia/thalami.

15 mm focus of diffusion abnormality seen involving the posterior
limb of the left internal capsule, consistent with an acute ischemic
infarct (series 5, image 26). Additional 5 mm acute ischemic infarct
noted involving the adjacent subinsular white matter (series 5,
image 23). No associated hemorrhage or mass effect. No other
evidence for acute or subacute ischemia. Gray-white matter
differentiation otherwise maintained. No acute intracranial
hemorrhage. Innumerable scattered chronic micro hemorrhages seen
throughout the brain, preponderance of which are clustered about the
cerebellum and thalami, likely related to poorly controlled
hypertension.

No mass lesion, midline shift or mass effect. No hydrocephalus or
extra-axial fluid collection. Pituitary gland suprasellar region
within normal limits. Midline structures intact.

Vascular: Major intracranial vascular flow voids are maintained.

Skull and upper cervical spine: Craniocervical junction within
normal limits. Bone marrow signal intensity normal. No scalp soft
tissue abnormality.

Sinuses/Orbits: Globes and orbital soft tissues within normal
limits. Paranasal sinuses are largely clear. No significant mastoid
effusion. Inner ear structures grossly normal.

Other: None.
IMPRESSION: 1. 15 mm acute ischemic nonhemorrhagic infarct involving the
posterior limb of the left internal capsule, with an additional 5 mm
acute ischemic nonhemorrhagic infarct involving the adjacent left
subinsular white matter.
2. Innumerable scattered chronic micro hemorrhages throughout the
brain, preponderance of which are clustered about the cerebellum and
thalami, likely related to poorly controlled hypertension.
3. Underlying age-related cerebral atrophy with advanced chronic
microvascular ischemic disease.
